# Patient Record
Sex: Male | Born: 1955 | Race: White | Hispanic: No | Smoking: Former smoker
Health system: Southern US, Community
[De-identification: ages and names within clinical notes are randomized; demographics above are authoritative.]

## PROBLEM LIST (undated history)

## (undated) DIAGNOSIS — J449 Chronic obstructive pulmonary disease, unspecified: Secondary | ICD-10-CM

## (undated) DIAGNOSIS — T7840XA Allergy, unspecified, initial encounter: Secondary | ICD-10-CM

## (undated) DIAGNOSIS — J45909 Unspecified asthma, uncomplicated: Secondary | ICD-10-CM

## (undated) DIAGNOSIS — R011 Cardiac murmur, unspecified: Secondary | ICD-10-CM

## (undated) DIAGNOSIS — I2510 Atherosclerotic heart disease of native coronary artery without angina pectoris: Secondary | ICD-10-CM

## (undated) DIAGNOSIS — M1990 Unspecified osteoarthritis, unspecified site: Secondary | ICD-10-CM

## (undated) HISTORY — PX: CORONARY ARTERY BYPASS GRAFT: SHX141

## (undated) HISTORY — DX: Atherosclerotic heart disease of native coronary artery without angina pectoris: I25.10

## (undated) HISTORY — DX: Unspecified osteoarthritis, unspecified site: M19.90

## (undated) HISTORY — DX: Cardiac murmur, unspecified: R01.1

## (undated) HISTORY — PX: HERNIA REPAIR: SHX51

## (undated) HISTORY — DX: Allergy, unspecified, initial encounter: T78.40XA

## (undated) HISTORY — PX: VASECTOMY: SHX75

## (undated) HISTORY — DX: Unspecified asthma, uncomplicated: J45.909

---

## 2004-04-18 ENCOUNTER — Ambulatory Visit: Payer: Self-pay | Admitting: Orthopedic Surgery

## 2004-07-28 ENCOUNTER — Ambulatory Visit: Payer: Self-pay | Admitting: Rheumatology

## 2004-08-12 ENCOUNTER — Ambulatory Visit: Payer: Self-pay | Admitting: Orthopaedic Surgery

## 2004-08-29 ENCOUNTER — Other Ambulatory Visit: Payer: Self-pay

## 2004-08-30 ENCOUNTER — Ambulatory Visit: Payer: Self-pay | Admitting: Orthopaedic Surgery

## 2006-10-23 IMAGING — NM NUCLEAR MEDICINE WHOLE BODY BONE SCINTIGRAPHY
3 series · 8 of 8 positions shown · non-contrast
Comparison: none

REASON FOR EXAM: RT shoulder pain, weight loss,  Dr's office specified
WHOLE BODY
COMMENTS:

[Series 1: 3 hr wholebody (masked) · 2.40mm/px · 2 of 2 frames shown]
[frame 1/2]
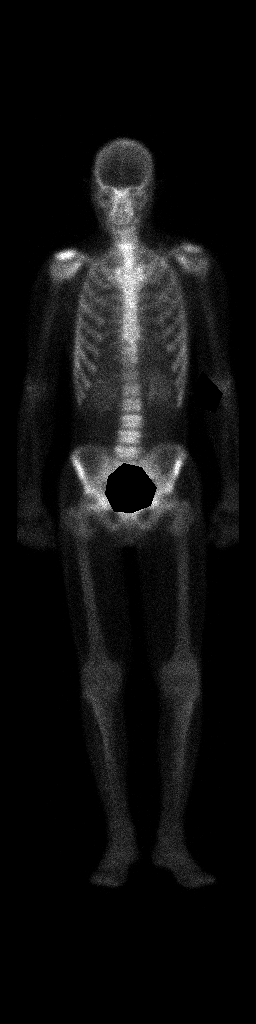
[frame 2/2]
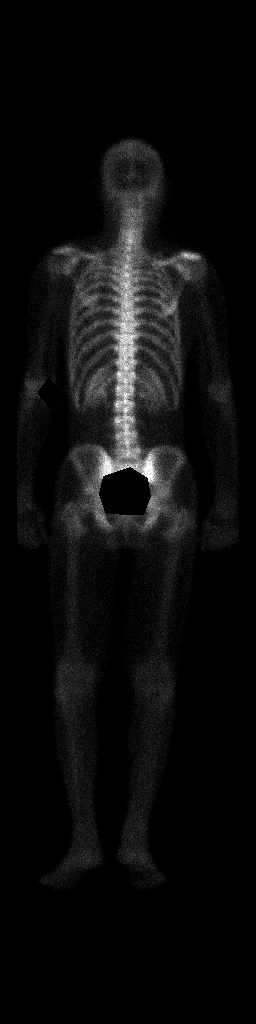

[Series 1: bone statics · 2.40mm/px · 2 acquisitions, 4 frames shown]
[im 1/2  full-range]
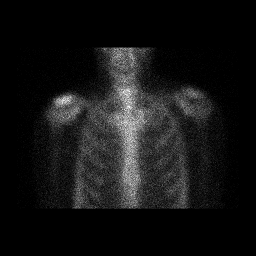
[im 1/2  full-range]
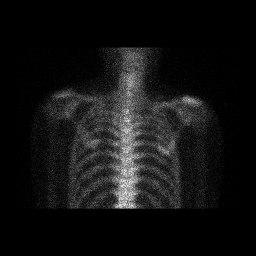
[im 2/2]
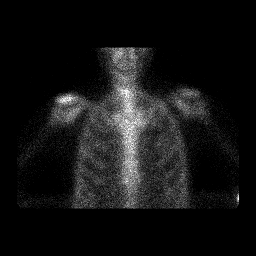
[im 2/2]
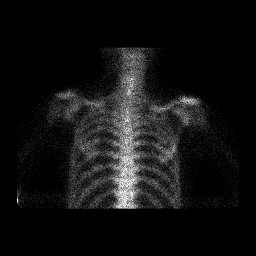

[Series 1: 3 hr wholebody · 2.40mm/px · 2 of 2 frames shown]
[frame 1/2]
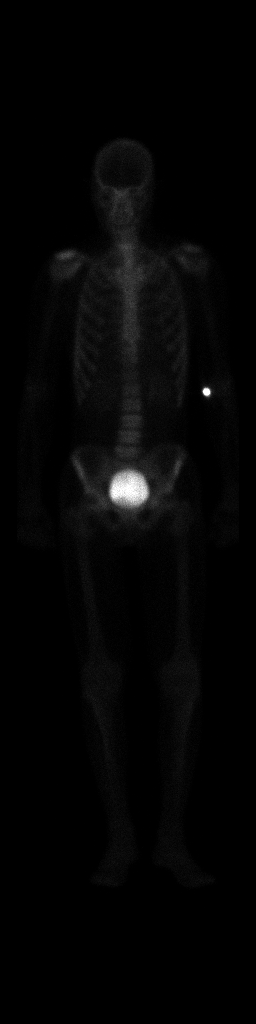
[frame 2/2]
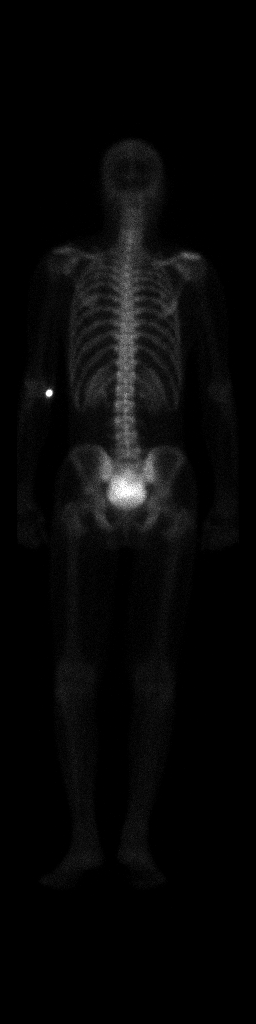

[8 of 8 positions shown; findings below may reference images not displayed]

PROCEDURE:     NM  - NM BONE WB 3 HR [DATE] [DATE]

RESULT:     After injection of 20.81 millicuries of technetium-00m HDP,
anterior and posterior total body gamma scintiphotos were obtained.  No
focal areas of increased tracer are identified.  There is noted increased
tracer in the AC joints bilaterally possibly slightly more on the RIGHT than
the LEFT. Does the patient have osteoarthritic change?  Otherwise the bone
scan is within normal limits.
IMPRESSION: Bilateral increased tracer in the AC joints which may represent
osteoarthritic change slightly greater on the RIGHT than the LEFT.

Otherwise the bone scan is within normal limits.

## 2014-04-27 DIAGNOSIS — Z8249 Family history of ischemic heart disease and other diseases of the circulatory system: Secondary | ICD-10-CM | POA: Insufficient documentation

## 2017-03-02 ENCOUNTER — Other Ambulatory Visit: Payer: Self-pay

## 2019-07-09 DIAGNOSIS — I214 Non-ST elevation (NSTEMI) myocardial infarction: Secondary | ICD-10-CM | POA: Insufficient documentation

## 2019-07-09 DIAGNOSIS — D696 Thrombocytopenia, unspecified: Secondary | ICD-10-CM | POA: Insufficient documentation

## 2019-07-10 DIAGNOSIS — E871 Hypo-osmolality and hyponatremia: Secondary | ICD-10-CM | POA: Insufficient documentation

## 2019-07-10 DIAGNOSIS — E785 Hyperlipidemia, unspecified: Secondary | ICD-10-CM | POA: Insufficient documentation

## 2019-07-10 DIAGNOSIS — J449 Chronic obstructive pulmonary disease, unspecified: Secondary | ICD-10-CM | POA: Diagnosis present

## 2019-07-13 DIAGNOSIS — I6523 Occlusion and stenosis of bilateral carotid arteries: Secondary | ICD-10-CM | POA: Insufficient documentation

## 2021-10-04 ENCOUNTER — Emergency Department: Payer: Medicare Other

## 2021-10-04 ENCOUNTER — Other Ambulatory Visit: Payer: Self-pay

## 2021-10-04 ENCOUNTER — Inpatient Hospital Stay
Admission: EM | Admit: 2021-10-04 | Discharge: 2021-10-06 | DRG: 190 | Disposition: A | Discharge status: Home / Self Care | Payer: Medicare Other | Attending: Internal Medicine | Admitting: Internal Medicine

## 2021-10-04 DIAGNOSIS — J441 Chronic obstructive pulmonary disease with (acute) exacerbation: Secondary | ICD-10-CM | POA: Diagnosis present

## 2021-10-04 DIAGNOSIS — I2510 Atherosclerotic heart disease of native coronary artery without angina pectoris: Secondary | ICD-10-CM | POA: Diagnosis present

## 2021-10-04 DIAGNOSIS — F17200 Nicotine dependence, unspecified, uncomplicated: Secondary | ICD-10-CM | POA: Diagnosis present

## 2021-10-04 DIAGNOSIS — R0902 Hypoxemia: Secondary | ICD-10-CM

## 2021-10-04 DIAGNOSIS — E43 Unspecified severe protein-calorie malnutrition: Secondary | ICD-10-CM | POA: Insufficient documentation

## 2021-10-04 DIAGNOSIS — R0603 Acute respiratory distress: Principal | ICD-10-CM

## 2021-10-04 DIAGNOSIS — F17210 Nicotine dependence, cigarettes, uncomplicated: Secondary | ICD-10-CM | POA: Diagnosis present

## 2021-10-04 DIAGNOSIS — J9601 Acute respiratory failure with hypoxia: Secondary | ICD-10-CM | POA: Diagnosis present

## 2021-10-04 DIAGNOSIS — Z880 Allergy status to penicillin: Secondary | ICD-10-CM

## 2021-10-04 DIAGNOSIS — Z951 Presence of aortocoronary bypass graft: Secondary | ICD-10-CM

## 2021-10-04 DIAGNOSIS — Z20822 Contact with and (suspected) exposure to covid-19: Secondary | ICD-10-CM | POA: Diagnosis present

## 2021-10-04 DIAGNOSIS — Z885 Allergy status to narcotic agent status: Secondary | ICD-10-CM | POA: Diagnosis not present

## 2021-10-04 DIAGNOSIS — J449 Chronic obstructive pulmonary disease, unspecified: Secondary | ICD-10-CM | POA: Diagnosis present

## 2021-10-04 DIAGNOSIS — Z681 Body mass index (BMI) 19 or less, adult: Secondary | ICD-10-CM

## 2021-10-04 HISTORY — DX: Chronic obstructive pulmonary disease, unspecified: J44.9

## 2021-10-04 LAB — COMPREHENSIVE METABOLIC PANEL
ALT: 16 U/L (ref 0–44)
AST: 18 U/L (ref 15–41)
Albumin: 4.2 g/dL (ref 3.5–5.0)
Alkaline Phosphatase: 41 U/L (ref 38–126)
Anion gap: 8 (ref 5–15)
BUN: 7 mg/dL — ABNORMAL LOW (ref 8–23)
CO2: 27 mmol/L (ref 22–32)
Calcium: 9.1 mg/dL (ref 8.9–10.3)
Chloride: 98 mmol/L (ref 98–111)
Creatinine, Ser: 0.88 mg/dL (ref 0.61–1.24)
GFR, Estimated: >60 mL/min (ref >60)
Glucose, Bld: 113 mg/dL — ABNORMAL HIGH (ref 70–99)
Potassium: 4.4 mmol/L (ref 3.5–5.1)
Sodium: 133 mmol/L — ABNORMAL LOW (ref 135–145)
Total Bilirubin: 0.5 mg/dL (ref 0.3–1.2)
Total Protein: 7.6 g/dL (ref 6.5–8.1)

## 2021-10-04 LAB — CBC WITH DIFFERENTIAL/PLATELET
Abs Immature Granulocytes: 0.01 10*3/uL (ref 0.00–0.07)
Basophils Absolute: 0.1 10*3/uL (ref 0.0–0.1)
Basophils Relative: 1 %
Eosinophils Absolute: 1.3 10*3/uL — ABNORMAL HIGH (ref 0.0–0.5)
Eosinophils Relative: 16 %
HCT: 49.3 % (ref 39.0–52.0)
Hemoglobin: 16.2 g/dL (ref 13.0–17.0)
Immature Granulocytes: 0 %
Lymphocytes Relative: 23 %
Lymphs Abs: 1.9 10*3/uL (ref 0.7–4.0)
MCH: 28.8 pg (ref 26.0–34.0)
MCHC: 32.9 g/dL (ref 30.0–36.0)
MCV: 87.7 fL (ref 80.0–100.0)
Monocytes Absolute: 0.6 10*3/uL (ref 0.1–1.0)
Monocytes Relative: 7 %
Neutro Abs: 4.4 10*3/uL (ref 1.7–7.7)
Neutrophils Relative %: 53 %
Platelets: 147 10*3/uL — ABNORMAL LOW (ref 150–400)
RBC: 5.62 MIL/uL (ref 4.22–5.81)
RDW: 14 % (ref 11.5–15.5)
Smear Review: NORMAL
WBC: 8.4 10*3/uL (ref 4.0–10.5)
nRBC: 0 % (ref 0.0–0.2)

## 2021-10-04 LAB — MAGNESIUM: Magnesium: 2 mg/dL (ref 1.7–2.4)

## 2021-10-04 LAB — BRAIN NATRIURETIC PEPTIDE: B Natriuretic Peptide: 12 pg/mL (ref 0.0–100.0)

## 2021-10-04 LAB — RESP PANEL BY RT-PCR (FLU A&B, COVID) ARPGX2
Influenza A by PCR: NEGATIVE
Influenza B by PCR: NEGATIVE
SARS Coronavirus 2 by RT PCR: NEGATIVE

## 2021-10-04 LAB — TROPONIN I (HIGH SENSITIVITY)
Troponin I (High Sensitivity): 9 ng/L (ref <18)
Troponin I (High Sensitivity): 71 ng/L — ABNORMAL HIGH (ref <18)

## 2021-10-04 MED ORDER — IPRATROPIUM-ALBUTEROL 0.5-2.5 (3) MG/3ML IN SOLN
3.0000 mL | Freq: Once | RESPIRATORY_TRACT | Status: AC
  Administered 2021-10-04: 3 mL via RESPIRATORY_TRACT
  Filled 2021-10-04: qty 3

## 2021-10-04 MED ORDER — PREDNISONE 20 MG PO TABS
40.0000 mg | ORAL_TABLET | Freq: Every day | ORAL | Status: DC
  Administered 2021-10-06: 40 mg via ORAL
  Filled 2021-10-04: qty 2

## 2021-10-04 MED ORDER — METHYLPREDNISOLONE SODIUM SUCC 40 MG IJ SOLR
40.0000 mg | Freq: Two times a day (BID) | INTRAMUSCULAR | Status: AC
  Administered 2021-10-05: 40 mg via INTRAVENOUS
  Filled 2021-10-04 (×2): qty 1

## 2021-10-04 MED ORDER — LORAZEPAM 2 MG/ML IJ SOLN: 0.5000 mg | Freq: Once | INTRAMUSCULAR | Status: DC

## 2021-10-04 MED ORDER — ONDANSETRON HCL 4 MG/2ML IJ SOLN: 4.0000 mg | Freq: Four times a day (QID) | INTRAMUSCULAR | Status: DC | PRN

## 2021-10-04 MED ORDER — SODIUM CHLORIDE 0.9% FLUSH: 3.0000 mL | INTRAVENOUS | Status: DC | PRN

## 2021-10-04 MED ORDER — SODIUM CHLORIDE 0.9% FLUSH
3.0000 mL | Freq: Two times a day (BID) | INTRAVENOUS | Status: DC
  Administered 2021-10-04 – 2021-10-06 (×5): 3 mL via INTRAVENOUS

## 2021-10-04 MED ORDER — ENOXAPARIN SODIUM 40 MG/0.4ML IJ SOSY
40.0000 mg | PREFILLED_SYRINGE | INTRAMUSCULAR | Status: DC
  Administered 2021-10-04: 40 mg via SUBCUTANEOUS
  Filled 2021-10-04 (×2): qty 0.4

## 2021-10-04 MED ORDER — ALBUTEROL SULFATE (2.5 MG/3ML) 0.083% IN NEBU
2.5000 mg | INHALATION_SOLUTION | Freq: Once | RESPIRATORY_TRACT | Status: AC
  Administered 2021-10-04: 2.5 mg via RESPIRATORY_TRACT
  Filled 2021-10-04: qty 3

## 2021-10-04 MED ORDER — ACETAMINOPHEN 325 MG PO TABS
650.0000 mg | ORAL_TABLET | Freq: Four times a day (QID) | ORAL | Status: DC | PRN
  Administered 2021-10-04: 650 mg via ORAL
  Filled 2021-10-04: qty 2

## 2021-10-04 MED ORDER — ALBUTEROL SULFATE (2.5 MG/3ML) 0.083% IN NEBU: 2.5000 mg | INHALATION_SOLUTION | RESPIRATORY_TRACT | Status: DC | PRN

## 2021-10-04 MED ORDER — SODIUM CHLORIDE 0.9 % IV SOLN: 250.0000 mL | INTRAVENOUS | Status: DC | PRN

## 2021-10-04 MED ORDER — DOXYCYCLINE HYCLATE 100 MG PO TABS
100.0000 mg | ORAL_TABLET | Freq: Two times a day (BID) | ORAL | Status: DC
  Administered 2021-10-04 – 2021-10-06 (×5): 100 mg via ORAL
  Filled 2021-10-04 (×5): qty 1

## 2021-10-04 MED ORDER — IPRATROPIUM-ALBUTEROL 0.5-2.5 (3) MG/3ML IN SOLN
3.0000 mL | Freq: Four times a day (QID) | RESPIRATORY_TRACT | Status: DC
  Administered 2021-10-04 – 2021-10-06 (×6): 3 mL via RESPIRATORY_TRACT
  Filled 2021-10-04 (×7): qty 3

## 2021-10-04 MED ORDER — MAGNESIUM SULFATE 2 GM/50ML IV SOLN
2.0000 g | Freq: Once | INTRAVENOUS | Status: AC
Start: 2021-10-04 — End: 2021-10-04
  Administered 2021-10-04: 2 g via INTRAVENOUS
  Filled 2021-10-04: qty 50

## 2021-10-04 MED ORDER — METHYLPREDNISOLONE SODIUM SUCC 125 MG IJ SOLR
125.0000 mg | Freq: Once | INTRAMUSCULAR | Status: AC
  Administered 2021-10-04: 125 mg via INTRAVENOUS
  Filled 2021-10-04: qty 2

## 2021-10-04 MED ORDER — ONDANSETRON HCL 4 MG PO TABS: 4.0000 mg | ORAL_TABLET | Freq: Four times a day (QID) | ORAL | Status: DC | PRN

## 2021-10-04 MED ORDER — ACETAMINOPHEN 650 MG RE SUPP: 650.0000 mg | Freq: Four times a day (QID) | RECTAL | Status: DC | PRN

## 2021-10-04 NOTE — Assessment & Plan Note (Signed)
Status post CABG ?Stable ?

## 2021-10-04 NOTE — H&P (Signed)
?History and Physical  ? ? ?Patient: Guy Bowen [id number] DOB: Apr 30, 1956 ?DOA: 10/04/2021 ?DOS: the patient was seen and examined on 10/04/2021 ?PCP: Pcp, No  ?Patient coming from: Home ? ?Chief Complaint:  ?Chief Complaint  ?Patient presents with  ? Shortness of Breath  ? ?HPI: Guy Bowen is a 66 y.o. male with medical history significant coronary artery disease status post CABG, nicotine dependence, COPD who presents to the ER for evaluation of worsening shortness of breath from his baseline associated with a cough productive of yellow phlegm. ?Patient states that he has had exertional shortness of breath for several months but on the morning of admission he developed shortness of breath at rest and did not improve after he used his nebulizer.  He called EMS and was noted to have trouble speaking in complete sentences.  Patient was unable to tolerate CPAP in the field and was placed on a nonrebreather mask.  He was transitioned to high flow oxygen in the ER ?He denies having any chest pain, no fever, no chills, no nausea, no palpitations, no diaphoresis, no changes in his bowel habits, no urinary symptoms, no blurred vision or focal deficit. ? ?Review of Systems: As mentioned in the history of present illness. All other systems reviewed and are negative. ?Past Medical History:  ?Diagnosis Date  ? COPD (chronic obstructive pulmonary disease) ([place])   ? ?Past Surgical History:  ?Procedure Laterality Date  ? CORONARY ARTERY BYPASS GRAFT    ? ?Social History:  reports that he has quit smoking. His smoking use included cigarettes. He has never used smokeless tobacco. He reports that he does not use drugs. No history on file for alcohol use. ? ?Allergies  ?Allergen Reactions  ? Penicillins Anaphylaxis  ? Codeine   ? ? ?History reviewed. No pertinent family history. ? ?Prior to Admission medications   ?Not on File  ? ? ?Physical Exam: ?Vitals:  ? 10/04/21 0604 10/04/21 0700 10/04/21 0830 10/04/21 0841  ?BP:  (!)  141/93 (!) 125/93   ?Pulse: (!) 121 (!) 104 99   ?Resp: (!) 27 (!) 21 16   ?Temp:      ?TempSrc:      ?SpO2: (!) 89% 99% 98% 97%  ?Weight:      ?Height:      ? ?Physical Exam ?Vitals and nursing note reviewed.  ?Constitutional:   ?   Appearance: He is well-developed.  ?   Comments: Chronically ill-appearing  ?HENT:  ?   Head: Normocephalic and atraumatic.  ?   Mouth/Throat:  ?   Mouth: Mucous membranes are moist.  ?Eyes:  ?   Pupils: Pupils are equal, round, and reactive to light.  ?Cardiovascular:  ?   Rate and Rhythm: Tachycardia present.  ?Pulmonary:  ?   Effort: Tachypnea present.  ?   Breath sounds: Examination of the right-upper field reveals wheezing. Examination of the left-upper field reveals wheezing. Examination of the right-middle field reveals wheezing. Examination of the left-middle field reveals wheezing. Examination of the right-lower field reveals wheezing. Examination of the left-lower field reveals wheezing. Wheezing present.  ?Abdominal:  ?   General: Bowel sounds are normal.  ?   Palpations: Abdomen is soft.  ?Musculoskeletal:     ?   General: Normal range of motion.  ?   Cervical back: Normal range of motion and neck supple.  ?Skin: ?   General: Skin is warm and dry.  ?Neurological:  ?   General: No focal deficit present.  ?  Mental Status: He is alert and oriented to person, place, and time.  ? ? ?Data Reviewed: ?Relevant notes from primary care and specialist visits, past discharge summaries as available in EHR, including Care Everywhere. ?Prior diagnostic testing as pertinent to current admission diagnoses ?Updated medications and problem lists for reconciliation ?ED course, including vitals, labs, imaging, treatment and response to treatment ?Triage notes, nursing and pharmacy notes and ED provider's notes ?Notable results as noted in HPI ?Labs reviewed.  BNP 12, white count 8.4, hemoglobin 16.2, hematocrit 49.3, RDW 14, platelet count 147, troponin 9, magnesium 2.0, sodium 133, potassium  4.4, glucose 113, BUN 7 ?Respiratory viral panel is negative ?Chest x-ray reviewed by me shows hyperinflated lung fields.  No evidence of acute cardiopulmonary disease. ?Twelve-lead EKG reviewed sinus tachycardia.  Left axis deviation. ?There are no new results to review at this time. ? ?Assessment and Plan: ?* COPD with acute exacerbation ([place]) ?Patient with a history of COPD who presents for worsening shortness of breath from his baseline associated with a cough productive of brown phlegm. ?He had increased work of breathing upon arrival to the ER and is currently on high flow oxygen ?We will attempt to wean off oxygen as tolerated ?Place patient on doxycycline 100 mg twice daily ?Place patient on scheduled and as needed bronchodilator therapy ? ?CAD (coronary artery disease) ?Status post CABG ?Stable ? ?Nicotine dependence ?Patient admits to continued nicotine use but has cut back. ?Declines a nicotine transdermal patch at this time ? ? ? ? ? Advance Care Planning:   Code Status: Full Code  ? ?Consults: None ? ?Family Communication: Greater than 50% of time was spent discussing patient's condition and plan of care with him at the bedside.  All questions and concerns have been addressed.  He verbalizes understanding and agrees with the plan. ? ?Severity of Illness: ?The appropriate patient status for this patient is INPATIENT. Inpatient status is judged to be reasonable and necessary in order to provide the required intensity of service to ensure the patient's safety. The patient's presenting symptoms, physical exam findings, and initial radiographic and laboratory data in the context of their chronic comorbidities is felt to place them at high risk for further clinical deterioration. Furthermore, it is not anticipated that the patient will be medically stable for discharge from the hospital within 2 midnights of admission.  ? ?* I certify that at the point of admission it is my clinical judgment that the patient  will require inpatient hospital care spanning beyond 2 midnights from the point of admission due to high intensity of service, high risk for further deterioration and high frequency of surveillance required.* ? ?Author: ?Collier Bullock, MD ?10/04/2021 9:01 AM ? ?For on call review [email or web address].  ?

## 2021-10-04 NOTE — ED Provider Notes (Signed)
----------------------------------------- ?  7:03 AM on 10/04/2021 ?----------------------------------------- ? ?Blood pressure (!) 141/93, pulse (!) 104, temperature (!) 97.5 ?F (36.4 ?C), temperature source Oral, resp. rate (!) 21, height 6' (1.829 m), weight 63.5 kg, SpO2 99 %. ? ?Assuming care from Dr. Beather Arbour.  In short, Guy Bowen is a 66 y.o. male with a chief complaint of Shortness of Breath ?Marland Kitchen  Refer to the original H&P for additional details. ? ?The current plan of care is to follow-up labs and reassess for COPD exacerbation, anticipate admission. ? ?----------------------------------------- ?8:05 AM on 10/04/2021 ?----------------------------------------- ?On reassessment, patient states he is feeling much better and is maintaining O2 sats on 30 L/min of high flow nasal cannula.  His work of breathing seems to be improving with respiratory rate in the low 20s, although he does have some ongoing wheezing.  We will give additional DuoNeb and attempt to wean to standard nasal cannula, case discussed with hospitalist for admission. ? ?  ?Blake Divine, MD ?10/04/21 [phone] ? ?

## 2021-10-04 NOTE — Assessment & Plan Note (Signed)
Patient admits to continued nicotine use but has cut back. ?Declines a nicotine transdermal patch at this time ?

## 2021-10-04 NOTE — Assessment & Plan Note (Signed)
Patient with a history of COPD who presents for worsening shortness of breath from his baseline associated with a cough productive of brown phlegm. ?He had increased work of breathing upon arrival to the ER and is currently on high flow oxygen ?We will attempt to wean off oxygen as tolerated ?Place patient on doxycycline 100 mg twice daily ?Place patient on scheduled and as needed bronchodilator therapy ?

## 2021-10-04 NOTE — Plan of Care (Signed)
  Problem: Activity: Goal: Ability to tolerate increased activity will improve Outcome: Progressing   

## 2021-10-04 NOTE — ED Provider Notes (Signed)
? ?[facility] ?Provider Note ? ? ? Event Date/Time  ? First MD Initiated Contact with Patient 10/04/21 [phone]   ?  (approximate) ? ? ?History  ? ?Shortness of Breath ? ? ?HPI ? ?Level of V caveat: Limited by 2-3 word sentences ? ?Guy BodoRobert Bowen is a 66 y.o. male brought to the ED via EMS from home with a chief complaint of respiratory distress x1.5 hours.  Patient with a history of COPD not on home oxygen.  Endorses chest tightness and mild cough.  Denies fever, chills, abdominal pain, nausea, vomiting or dizziness.  EMS gave 1 spray nitroglycerin with improvement in patient's blood pressure.  Patient could not tolerate CPAP.  Arrives to the ED on nonrebreather oxygen. ?  ? ? ?Past Medical History  ?COPD ?CAD ? ?Active Problem List  ?There are no problems to display for this patient. ? ? ? ?Past Surgical History  ?CABG ? ? ?Home Medications  ? ?Prior to Admission medications   ?Not on File  ? ? ? ?Allergies  ?Penicillins ? ? ?Family History  ?History reviewed. No pertinent family history. ? ? ?Physical Exam  ?Triage Vital Signs: ?ED Triage Vitals  ?Enc Vitals Group  ?   BP   ?   Pulse   ?   Resp   ?   Temp   ?   Temp src   ?   SpO2   ?   Weight   ?   Height   ?   Head Circumference   ?   Peak Flow   ?   Pain Score   ?   Pain Loc   ?   Pain Edu?   ?   Excl. in GC?   ? ? ?Updated Vital Signs: ?BP (!) 141/93   Pulse (!) 104   Temp (!) 97.5 ?F (36.4 ?C) (Oral)   Resp (!) 21   Ht 6' (1.829 m)   Wt 63.5 kg   SpO2 99%   BMI 18.99 kg/m?  ? ? ?General: Awake, moderate distress.  ?CV:  Tachycardic.  Good peripheral perfusion.  ?Resp:  Increased effort.  Diminished aeration bilaterally with scattered wheezing.  Tripoding, guppy breathing. ?Abd:  Nontender.  No distention.  ?Other:  Calves are nontender and nonswollen. ? ? ?ED Results / Procedures / Treatments  ?Labs ?(all labs ordered are listed, but only abnormal results are displayed) ?Labs Reviewed  ?COMPREHENSIVE METABOLIC PANEL - Abnormal;  Notable for the following components:  ?    Result Value  ? Sodium 133 (*)   ? Glucose, Bld 113 (*)   ? BUN 7 (*)   ? All other components within normal limits  ?RESP PANEL BY RT-PCR (FLU A&B, COVID) ARPGX2  ?MAGNESIUM  ?CBC WITH DIFFERENTIAL/PLATELET  ?BRAIN NATRIURETIC PEPTIDE  ?TROPONIN I (HIGH SENSITIVITY)  ? ? ? ?EKG ? ?ED ECG REPORT ?I, Irean HongSUNG,Kemoni Ortega J, the attending physician, personally viewed and interpreted this ECG. ? ? Date: 10/04/2021 ? EKG Time: 0553 ? Rate: 120 ? Rhythm: sinus tachycardia ? Axis: Normal ? Intervals:none ? ST&T Change: Nonspecific ? ? ? ?RADIOLOGY ?I have independently visualized and interpreted patient's chest x-ray as well as noted the radiology interpretation: ? ?Chest x-ray: No acute cardiopulmonary process ? ?Official radiology report(s): ?DG Chest Port 1 View ? ?Result Date: 10/04/2021 ?CLINICAL DATA:  Shortness of breath with a history of COPD. EXAM: PORTABLE CHEST 1 VIEW COMPARISON:  None. FINDINGS: There is overlying monitor wiring. There are intact sternotomy sutures  with CABG changes. Heart size and vasculature are normal apart from mild aortic atherosclerosis. The lungs are emphysematous but clear. The mediastinum is normally outlined. Mild osteopenia. IMPRESSION: No evidence of acute chest disease. COPD. Prior CABG. Aortic atherosclerosis. Electronically Signed   By: Almira Bar M.D.   On: 10/04/2021 06:13   ? ? ?PROCEDURES: ? ?Critical Care performed: Yes, see critical care procedure note(s) ? ?CRITICAL CARE ?Performed by: Irean Hong ? ? ?Total critical care time: 30 minutes ? ?Critical care time was exclusive of separately billable procedures and treating other patients. ? ?Critical care was necessary to treat or prevent imminent or life-threatening deterioration. ? ?Critical care was time spent personally by me on the following activities: development of treatment plan with patient and/or surrogate as well as nursing, discussions with consultants, evaluation of patient's  response to treatment, examination of patient, obtaining history from patient or surrogate, ordering and performing treatments and interventions, ordering and review of laboratory studies, ordering and review of radiographic studies, pulse oximetry and re-evaluation of patient's condition. ? ? ?.1-3 Lead EKG Interpretation ?Performed by: Irean Hong, MD ?Authorized by: Irean Hong, MD  ? ?  Interpretation: abnormal   ?  ECG rate:  120 ?  ECG rate assessment: tachycardic   ?  Rhythm: sinus tachycardia   ?  Ectopy: none   ?  Conduction: normal   ?Comments:  ?   Patient placed on cardiac monitor to evaluate for arrhythmias ? ? ?MEDICATIONS ORDERED IN ED: ?Medications  ?LORazepam (ATIVAN) injection 0.5 mg (0 mg Intravenous Hold 10/04/21 0617)  ?ipratropium-albuterol (DUONEB) 0.5-2.5 (3) MG/3ML nebulizer solution 3 mL (3 mLs Nebulization Given 10/04/21 0607)  ?methylPREDNISolone sodium succinate (SOLU-MEDROL) 125 mg/2 mL injection 125 mg (125 mg Intravenous Given 10/04/21 0606)  ?magnesium sulfate IVPB 2 g 50 mL (2 g Intravenous New Bag/Given 10/04/21 0607)  ?ipratropium-albuterol (DUONEB) 0.5-2.5 (3) MG/3ML nebulizer solution 3 mL (3 mLs Nebulization Given 10/04/21 0607)  ?ipratropium-albuterol (DUONEB) 0.5-2.5 (3) MG/3ML nebulizer solution 3 mL (3 mLs Nebulization Given 10/04/21 0607)  ? ? ? ?IMPRESSION / MDM / ASSESSMENT AND PLAN / ED COURSE  ?I reviewed the triage vital signs and the nursing notes. ?             ?               ?66 year old male presenting to the ED in respiratory distress. Differential includes, but is not limited to, viral syndrome, bronchitis including COPD exacerbation, pneumonia, reactive airway disease including asthma, CHF including exacerbation with or without pulmonary/interstitial edema, pneumothorax, ACS, thoracic trauma, and pulmonary embolism.  I have personally reviewed patient's records and see that there are no old records to review. ? ?The patient is on the cardiac monitor to evaluate for  evidence of arrhythmia and/or significant heart rate changes. ? ?We will obtain cardiac panel, chest x-ray, respiratory panel.  Will initiate treatment with stacked DuoNebs, IV Solu-Medrol, IV magnesium.  Will administer low-dose anxiolytic so patient may tolerate BiPAP.  Anticipate hospitalization. ? ?Clinical Course as of 10/04/21 0708  ?Tue Oct 04, 2021  ?2542 Patient placed on high flow nasal cannula due to not being able to tolerate BiPAP mask.  He is looking and feeling better.  Awaiting lab work. [JS]  ?7062 Care transferred to Dr. Larinda Buttery at change of shift pending labwork and admission. [JS]  ?  ?Clinical Course User Index ?[JS] Irean Hong, MD  ? ? ? ?FINAL CLINICAL IMPRESSION(S) / ED DIAGNOSES  ? ?  Final diagnoses:  ?Respiratory distress  ?COPD exacerbation (HCC)  ?Hypoxia  ? ? ? ?Rx / DC Orders  ? ?ED Discharge Orders   ? ? None  ? ?  ? ? ? ?Note:  This document was prepared using Dragon voice recognition software and may include unintentional dictation errors. ?  ?Irean Hong, MD ?10/04/21 [phone] ? ?

## 2021-10-04 NOTE — ED Notes (Signed)
Informed RN bed assigned 

## 2021-10-04 NOTE — ED Triage Notes (Signed)
Pt presents to ER c/o sob x1.5 hours.  Pt has hx of copd and used a neb tx without relief.  Pt denies wearing O2 at home.  Pt was 92% on RA at home on ems arrival.  Pt having trouble speaking in complete sentences at this time.  Pt received one spay of nitro w/ems pta.  Pt is A&O x4 at this time.   ?

## 2021-10-05 DIAGNOSIS — J441 Chronic obstructive pulmonary disease with (acute) exacerbation: Secondary | ICD-10-CM | POA: Diagnosis not present

## 2021-10-05 LAB — BASIC METABOLIC PANEL
Anion gap: 6 (ref 5–15)
BUN: 8 mg/dL (ref 8–23)
CO2: 27 mmol/L (ref 22–32)
Calcium: 8.7 mg/dL — ABNORMAL LOW (ref 8.9–10.3)
Chloride: 96 mmol/L — ABNORMAL LOW (ref 98–111)
Creatinine, Ser: 0.74 mg/dL (ref 0.61–1.24)
GFR, Estimated: >60 mL/min (ref >60)
Glucose, Bld: 104 mg/dL — ABNORMAL HIGH (ref 70–99)
Potassium: 4.5 mmol/L (ref 3.5–5.1)
Sodium: 129 mmol/L — ABNORMAL LOW (ref 135–145)

## 2021-10-05 LAB — CBC
HCT: 42.5 % (ref 39.0–52.0)
Hemoglobin: 14.4 g/dL (ref 13.0–17.0)
MCH: 29.1 pg (ref 26.0–34.0)
MCHC: 33.9 g/dL (ref 30.0–36.0)
MCV: 86 fL (ref 80.0–100.0)
Platelets: 140 10*3/uL — ABNORMAL LOW (ref 150–400)
RBC: 4.94 MIL/uL (ref 4.22–5.81)
RDW: 13.9 % (ref 11.5–15.5)
WBC: 11.8 10*3/uL — ABNORMAL HIGH (ref 4.0–10.5)
nRBC: 0 % (ref 0.0–0.2)

## 2021-10-05 LAB — HIV ANTIBODY (ROUTINE TESTING W REFLEX): HIV Screen 4th Generation wRfx: NONREACTIVE

## 2021-10-05 MED ORDER — ARFORMOTEROL TARTRATE 15 MCG/2ML IN NEBU
15.0000 ug | INHALATION_SOLUTION | Freq: Two times a day (BID) | RESPIRATORY_TRACT | Status: DC
  Administered 2021-10-05 – 2021-10-06 (×2): 15 ug via RESPIRATORY_TRACT
  Filled 2021-10-05 (×2): qty 2

## 2021-10-05 MED ORDER — BUDESONIDE 0.25 MG/2ML IN SUSP
0.2500 mg | Freq: Two times a day (BID) | RESPIRATORY_TRACT | Status: DC
  Administered 2021-10-05 – 2021-10-06 (×2): 0.25 mg via RESPIRATORY_TRACT
  Filled 2021-10-05 (×3): qty 2

## 2021-10-05 MED ORDER — ADULT MULTIVITAMIN W/MINERALS CH
1.0000 | ORAL_TABLET | Freq: Every day | ORAL | Status: DC
  Administered 2021-10-05 – 2021-10-06 (×2): 1 via ORAL
  Filled 2021-10-05 (×2): qty 1

## 2021-10-05 MED ORDER — ENSURE ENLIVE PO LIQD
237.0000 mL | Freq: Two times a day (BID) | ORAL | Status: DC
  Administered 2021-10-06: 237 mL via ORAL

## 2021-10-05 NOTE — Progress Notes (Addendum)
Initial Nutrition Assessment ? ?DOCUMENTATION CODES:  ? ?Underweight, Severe malnutrition in context of chronic illness ? ?INTERVENTION:  ? ?-MVI with minerals daily ?-Ensure Enlive po BID, each supplement provides 350 kcal and 20 grams of protein ?-Liberalize diet to regular for widest variety of food selections ? ?NUTRITION DIAGNOSIS:  ? ?Severe Malnutrition related to chronic illness (COPD) as evidenced by moderate fat depletion, severe fat depletion, moderate muscle depletion, severe muscle depletion. ? ?GOAL:  ? ?Patient will meet greater than or equal to 90% of their needs ? ?MONITOR:  ? ?PO intake, Supplement acceptance, Labs, Weight trends, Skin, I & O's ? ?REASON FOR ASSESSMENT:  ? ?Malnutrition Screening Tool ?  ? ?ASSESSMENT:  ? ?Guy Bowen is a 66 y.o. male with medical history significant coronary artery disease status post CABG, nicotine dependence, COPD who presents to the ER for evaluation of worsening shortness of breath from his baseline associated with a cough productive of yellow phlegm. ? ?Pt admitted with COPD exacerbation.   ? ?Reviewed I/O's: -410 ml x 24 hours ? ?UOP: 700 ml x 24 hours ? ?Spoke with pt at bedside, who was pleasant and in good spirits today. He reports his appetite has improved since hospitalization and has been eating 100% of his meals. He shares that he has had poor oral intake over the past few weeks in addition to weakness secondary to shortness of breath. He shares that it has been more difficult to walk to the kitchen to prepare meals and coffee. He usually consumes 2 meals per day, which consists of simple foods such as sandwiches and hot dogs.  ? ?Pt reports his UBW is around 140#, but admits to a 15 pound wt loss over the past few months. Per pt, he has not paid as much attention to his health since 2018, as he was caring for his wife with pancreatic cancer and has been grieving her passing. RD provided emotional support.  ? ?Discussed importance of good meal and  supplement intake to promote healing.  ? ?Medications reviewed and include ativan and solu-medrol.  ? ?Labs reviewed: Na: 129.   ? ?NUTRITION - FOCUSED PHYSICAL EXAM: ? ?Flowsheet Row Most Recent Value  ?Orbital Region Moderate depletion  ?Upper Arm Region Severe depletion  ?Thoracic and Lumbar Region Severe depletion  ?Buccal Region Severe depletion  ?Temple Region Severe depletion  ?Clavicle Bone Region Severe depletion  ?Clavicle and Acromion Bone Region Severe depletion  ?Scapular Bone Region Severe depletion  ?Dorsal Hand Moderate depletion  ?Patellar Region Moderate depletion  ?Anterior Thigh Region Moderate depletion  ?Posterior Calf Region Moderate depletion  ?Edema (RD Assessment) None  ?Hair Reviewed  ?Eyes Reviewed  ?Mouth Reviewed  ?Skin Reviewed  ?Nails Reviewed  ? ?  ? ? ?Diet Order:   ?Diet Order   ? ?       ?  Diet 2 gram sodium Room service appropriate? Yes; Fluid consistency: Thin  Diet effective now       ?  ? ?  ?  ? ?  ? ? ?EDUCATION NEEDS:  ? ?Education needs have been addressed ? ?Skin:  Skin Assessment: Reviewed RN Assessment ? ?Last BM:  10/05/21 ? ?Height:  ? ?Ht Readings from Last 1 Encounters:  ?10/04/21 5\' 11"  (1.803 m)  ? ? ?Weight:  ? ?Wt Readings from Last 1 Encounters:  ?10/04/21 57.3 kg  ? ? ?Ideal Body Weight:  78.2 kg ? ?BMI:  Body mass index is 17.62 kg/m?. ? ?Estimated Nutritional Needs:  ? ?  Kcal:  2100-2300 ? ?Protein:  110-125 grams ? ?Fluid:  > 2 L ? ? ? ?Levada Schilling, RD, LDN, CDCES ?Registered Dietitian II ?Certified Diabetes Care and Education Specialist ?Please refer to [facility] for RD and/or RD on-call/weekend/after hours pager  ?

## 2021-10-05 NOTE — Progress Notes (Signed)
?PROGRESS NOTE ? ? ? ?Guy Bowen  XBM:[id number] DOB: 1955-07-28 DOA: 10/04/2021 ?PCP: Pcp, No  ? ? ?Brief Narrative:  ? 66 y.o. male with medical history significant coronary artery disease status post CABG, nicotine dependence, COPD who presents to the ER for evaluation of worsening shortness of breath from his baseline associated with a cough productive of yellow phlegm. ?Patient states that he has had exertional shortness of breath for several months but on the morning of admission he developed shortness of breath at rest and did not improve after he used his nebulizer.  He called EMS and was noted to have trouble speaking in complete sentences.  Patient was unable to tolerate CPAP in the field and was placed on a nonrebreather mask.  He was transitioned to high flow oxygen in the ER ?He denies having any chest pain, no fever, no chills, no nausea, no palpitations, no diaphoresis, no changes in his bowel habits, no urinary symptoms, no blurred vision or focal deficit. ? ? ? ? ?Assessment & Plan: ?  ?Principal Problem: ?  COPD with acute exacerbation (HCC) ?Active Problems: ?  CAD (coronary artery disease) ?  Nicotine dependence ? ?* COPD with acute exacerbation (HCC) ?Acute hypoxic respiratory failure secondary to above ?Patient with a history of COPD who presents for worsening shortness of breath from his baseline associated with a cough productive of brown phlegm. ?He had increased work of breathing upon arrival to the ER  ?Initially required high flow oxygen ?Plan: ?Now on 2 L oxygen, wean as tolerated ?Stress incentive spirometry use ?Steroids ?Bronchodilators ?Empiric doxycycline ?Possible discharge in 24 hours if able to wean from oxygen ?  ?CAD (coronary artery disease) ?Status post CABG ?Stable ?  ?Nicotine dependence ?Patient admits to continued nicotine use but has cut back. ?Offer nicotine patch, patient inclined ? ? ?DVT prophylaxis: SQ Lovenox ?Code Status: Full ?Family Communication:  None ?Disposition Plan: Status is: Inpatient ?Remains inpatient appropriate because: Acute exacerbation of COPD.  Anticipate discharge 4/6 ? ? ?Level of care: Progressive ? ?Consultants:  ?None ? ?Procedures:  ?None ? ?Antimicrobials: ?Doxycycline ? ? ?Subjective: ?Seen and examined.  No acute distress.  Sitting up in bed.  Endorses that shortness of breath is improving but not yet at baseline ? ?Objective: ?Vitals:  ? 10/05/21 0410 10/05/21 0821 10/05/21 0917 10/05/21 1247  ?BP: 118/72  108/70 104/72  ?Pulse: 88  99 88  ?Resp: 18  18 20   ?Temp: 99.4 ?F (37.4 ?C)  98 ?F (36.7 ?C) 98 ?F (36.7 ?C)  ?TempSrc: Oral     ?SpO2: 99% 98% 95% 97%  ?Weight:      ?Height:      ? ? ?Intake/Output Summary (Last 24 hours) at 10/05/2021 1453 ?Last data filed at 10/05/2021 0500 ?Gross per 24 hour  ?Intake 240 ml  ?Output 700 ml  ?Net -460 ml  ? ?Filed Weights  ? 10/04/21 0552 10/04/21 1807  ?Weight: 63.5 kg 57.3 kg  ? ? ?Examination: ? ?General exam: Appears calm and comfortable  ?Respiratory system: Scattered crackles bilaterally.  Mild end expiratory wheeze.  Normal work of breathing.  2 L ?Cardiovascular system: S1-S2, RRR, no murmurs, no pedal edema ?Gastrointestinal system: Soft, NT/ND, normal bowel sounds ?Central nervous system: Alert and oriented. No focal neurological deficits. ?Extremities: Symmetric 5 x 5 power. ?Skin: No rashes, lesions or ulcers ?Psychiatry: Judgement and insight appear normal. Mood & affect appropriate.  ? ? ? ?Data Reviewed: I have personally reviewed following labs and imaging studies ? ?  CBC: ?Recent Labs  ?Lab 10/04/21 ?2671 10/05/21 ?2458  ?WBC 8.4 11.8*  ?NEUTROABS 4.4  --   ?HGB 16.2 14.4  ?HCT 49.3 42.5  ?MCV 87.7 86.0  ?PLT 147* 140*  ? ?Basic Metabolic Panel: ?Recent Labs  ?Lab 10/04/21 ?0998 10/05/21 ?3382  ?NA 133* 129*  ?K 4.4 4.5  ?CL 98 96*  ?CO2 27 27  ?GLUCOSE 113* 104*  ?BUN 7* 8  ?CREATININE 0.88 0.74  ?CALCIUM 9.1 8.7*  ?MG 2.0  --   ? ?GFR: ?Estimated Creatinine Clearance: 72.6 mL/min  (by C-G formula based on SCr of 0.74 mg/dL). ?Liver Function Tests: ?Recent Labs  ?Lab 10/04/21 ?5053  ?AST 18  ?ALT 16  ?ALKPHOS 41  ?BILITOT 0.5  ?PROT 7.6  ?ALBUMIN 4.2  ? ?No results for input(s): LIPASE, AMYLASE in the last 168 hours. ?No results for input(s): AMMONIA in the last 168 hours. ?Coagulation Profile: ?No results for input(s): INR, PROTIME in the last 168 hours. ?Cardiac Enzymes: ?No results for input(s): CKTOTAL, CKMB, CKMBINDEX, TROPONINI in the last 168 hours. ?BNP (last 3 results) ?No results for input(s): PROBNP in the last 8760 hours. ?HbA1C: ?No results for input(s): HGBA1C in the last 72 hours. ?CBG: ?No results for input(s): GLUCAP in the last 168 hours. ?Lipid Profile: ?No results for input(s): CHOL, HDL, LDLCALC, TRIG, CHOLHDL, LDLDIRECT in the last 72 hours. ?Thyroid Function Tests: ?No results for input(s): TSH, T4TOTAL, FREET4, T3FREE, THYROIDAB in the last 72 hours. ?Anemia Panel: ?No results for input(s): VITAMINB12, FOLATE, FERRITIN, TIBC, IRON, RETICCTPCT in the last 72 hours. ?Sepsis Labs: ?No results for input(s): PROCALCITON, LATICACIDVEN in the last 168 hours. ? ?Recent Results (from the past 240 hour(s))  ?Resp Panel by RT-PCR (Flu A&B, Covid) Nasopharyngeal Swab     Status: None  ? Collection Time: 10/04/21  6:04 AM  ? Specimen: Nasopharyngeal Swab; Nasopharyngeal(NP) swabs in vial transport medium  ?Result Value Ref Range Status  ? SARS Coronavirus 2 by RT PCR NEGATIVE NEGATIVE Final  ?  Comment: (NOTE) ?SARS-CoV-2 target nucleic acids are NOT DETECTED. ? ?The SARS-CoV-2 RNA is generally detectable in upper respiratory ?specimens during the acute phase of infection. The lowest ?concentration of SARS-CoV-2 viral copies this assay can detect is ?138 copies/mL. A negative result does not preclude SARS-Cov-2 ?infection and should not be used as the sole basis for treatment or ?other patient management decisions. A negative result may occur with  ?improper specimen  collection/handling, submission of specimen other ?than nasopharyngeal swab, presence of viral mutation(s) within the ?areas targeted by this assay, and inadequate number of viral ?copies(<138 copies/mL). A negative result must be combined with ?clinical observations, patient history, and epidemiological ?information. The expected result is Negative. ? ?Fact Sheet for Patients:  ?BloggerCourse.com ? ?Fact Sheet for Healthcare Providers:  ?SeriousBroker.it ? ?This test is no t yet approved or cleared by the [place] FDA and  ?has been authorized for detection and/or diagnosis of SARS-CoV-2 by ?FDA under an Emergency Use Authorization (EUA). This EUA will remain  ?in effect (meaning this test can be used) for the duration of the ?COVID-19 declaration under Section 564(b)(1) of the Act, 21 ?U.S.C.section 360bbb-3(b)(1), unless the authorization is terminated  ?or revoked sooner.  ? ? ?  ? Influenza A by PCR NEGATIVE NEGATIVE Final  ? Influenza B by PCR NEGATIVE NEGATIVE Final  ?  Comment: (NOTE) ?The Xpert Xpress SARS-CoV-2/FLU/RSV plus assay is intended as an aid ?in the diagnosis of influenza from Nasopharyngeal swab specimens and ?should  not be used as a sole basis for treatment. Nasal washings and ?aspirates are unacceptable for Xpert Xpress SARS-CoV-2/FLU/RSV ?testing. ? ?Fact Sheet for Patients: ?BloggerCourse.comhttps://www.fda.gov/media/152166/download ? ?Fact Sheet for Healthcare Providers: ?SeriousBroker.ithttps://www.fda.gov/media/152162/download ? ?This test is not yet approved or cleared by the [place] FDA and ?has been authorized for detection and/or diagnosis of SARS-CoV-2 by ?FDA under an Emergency Use Authorization (EUA). This EUA will remain ?in effect (meaning this test can be used) for the duration of the ?COVID-19 declaration under Section 564(b)(1) of the Act, 21 U.S.C. ?section 360bbb-3(b)(1), unless the authorization is terminated or ?revoked. ? ?Performed at [facility], [address] [place], ?[place] [id number] ?  ?  ? ? ? ? ? ?Radiology Studies: ?DG Chest Port 1 View ? ?Result Date: 10/04/2021 ?CLINICAL DATA:  Shortness of breath with a history of COPD. EXAM: PORTABLE CHEST 1 VIE

## 2021-10-06 ENCOUNTER — Inpatient Hospital Stay (HOSPITAL_COMMUNITY)
Admit: 2021-10-06 | Discharge: 2021-10-06 | Disposition: A | Discharge status: Home / Self Care | Payer: Medicare Other | Attending: Internal Medicine | Admitting: Internal Medicine

## 2021-10-06 ENCOUNTER — Other Ambulatory Visit: Payer: Self-pay

## 2021-10-06 DIAGNOSIS — E43 Unspecified severe protein-calorie malnutrition: Secondary | ICD-10-CM | POA: Insufficient documentation

## 2021-10-06 DIAGNOSIS — I5031 Acute diastolic (congestive) heart failure: Secondary | ICD-10-CM

## 2021-10-06 DIAGNOSIS — J441 Chronic obstructive pulmonary disease with (acute) exacerbation: Secondary | ICD-10-CM | POA: Diagnosis not present

## 2021-10-06 LAB — ECHOCARDIOGRAM COMPLETE
AR max vel: 2.74 cm2
AV Area VTI: 3.13 cm2
AV Area mean vel: 2.76 cm2
AV Mean grad: 2 mmHg
AV Peak grad: 4.6 mmHg
Ao pk vel: 1.07 m/s
Area-P 1/2: 4.89 cm2
Height: 71 in
MV VTI: 2.63 cm2
S' Lateral: 2.39 cm
Weight: 2021.18 oz

## 2021-10-06 MED ORDER — PREDNISONE 20 MG PO TABS
40.0000 mg | ORAL_TABLET | Freq: Every day | ORAL | 0 refills | Status: AC
  Filled 2021-10-06 (×2): qty 8, 4d supply, fill #0

## 2021-10-06 MED ORDER — PREDNISONE 20 MG PO TABS: 40.0000 mg | ORAL_TABLET | Freq: Every day | ORAL | 0 refills | Status: DC

## 2021-10-06 MED ORDER — DOXYCYCLINE HYCLATE 100 MG PO TABS: 100.0000 mg | ORAL_TABLET | Freq: Two times a day (BID) | ORAL | 0 refills | Status: DC

## 2021-10-06 MED ORDER — BENZONATATE 100 MG PO CAPS
100.0000 mg | ORAL_CAPSULE | Freq: Three times a day (TID) | ORAL | 0 refills | Status: DC | PRN
Start: 2021-10-06 — End: 2022-03-08
  Filled 2021-10-06: qty 30, 10d supply, fill #0

## 2021-10-06 MED ORDER — ALBUTEROL SULFATE (2.5 MG/3ML) 0.083% IN NEBU: 2.5000 mg | INHALATION_SOLUTION | RESPIRATORY_TRACT | 1 refills | Status: DC | PRN

## 2021-10-06 MED ORDER — ALBUTEROL SULFATE HFA 108 (90 BASE) MCG/ACT IN AERS
2.0000 | INHALATION_SPRAY | Freq: Four times a day (QID) | RESPIRATORY_TRACT | 1 refills | Status: DC | PRN
  Filled 2021-10-06: qty 6.7, 25d supply, fill #0

## 2021-10-06 MED ORDER — ALBUTEROL SULFATE HFA 108 (90 BASE) MCG/ACT IN AERS
2.0000 | INHALATION_SPRAY | Freq: Four times a day (QID) | RESPIRATORY_TRACT | 1 refills | Status: DC | PRN
Start: 2021-10-06 — End: 2021-10-06

## 2021-10-06 MED ORDER — BENZONATATE 100 MG PO CAPS: 100.0000 mg | ORAL_CAPSULE | Freq: Three times a day (TID) | ORAL | 0 refills | Status: DC | PRN

## 2021-10-06 MED ORDER — ALBUTEROL SULFATE (2.5 MG/3ML) 0.083% IN NEBU
2.5000 mg | INHALATION_SOLUTION | RESPIRATORY_TRACT | 1 refills | Status: DC | PRN
  Filled 2021-10-06 (×2): qty 90, 5d supply, fill #0

## 2021-10-06 MED ORDER — DOXYCYCLINE HYCLATE 100 MG PO TABS
100.0000 mg | ORAL_TABLET | Freq: Two times a day (BID) | ORAL | 0 refills | Status: AC
  Filled 2021-10-06: qty 6, 3d supply, fill #0

## 2021-10-06 NOTE — Progress Notes (Addendum)
Discharge instructions explained to pt/ verbalized an understanding/ iv removed/ TOC to assist with PCP and medications / filled RX to be delivered to pts room / taxi voucher provided/ will transport off unit via wheelchair ?

## 2021-10-06 NOTE — Discharge Summary (Signed)
Physician Discharge Summary  ?Guy Bowen TKW:[id number] DOB: 15-Jan-1956 DOA: 10/04/2021 ? ?PCP: Pcp, No ? ?Admit date: 10/04/2021 ?Discharge date: 10/06/2021 ? ?Admitted From: Home ?Disposition: Home ? ?Recommendations for Outpatient Follow-up:  ?Follow up with PCP in 1-2 weeks ?Establish care with pulmonology ? ?Home Health: No ?Equipment/Devices: None ? ?Discharge Condition: Stable ?CODE STATUS: Full ?Diet recommendation: Regular ? ?Brief/Interim Summary: ? ? 66 y.o. male with medical history significant coronary artery disease status post CABG, nicotine dependence, COPD who presents to the ER for evaluation of worsening shortness of breath from his baseline associated with a cough productive of yellow phlegm. ?Patient states that he has had exertional shortness of breath for several months but on the morning of admission he developed shortness of breath at rest and did not improve after he used his nebulizer.  He called EMS and was noted to have trouble speaking in complete sentences.  Patient was unable to tolerate CPAP in the field and was placed on a nonrebreather mask.  He was transitioned to high flow oxygen in the ER ?He denies having any chest pain, no fever, no chills, no nausea, no palpitations, no diaphoresis, no changes in his bowel habits, no urinary symptoms, no blurred vision or focal deficit. ? ?Respiratory status stabilized.  Patient weaned to room air.  Stable at time of discharge.  Will establish care with PCP.  Referral to [facility] pulmonology at time of discharge.  On discharge will recommend albuterol MDI, albuterol nebulizer, prednisone 40 mg a day x4 additional days, Tessalon Perles as needed ? ? ?Discharge Diagnoses:  ?Principal Problem: ?  COPD with acute exacerbation (HCC) ?Active Problems: ?  CAD (coronary artery disease) ?  Nicotine dependence ?  Protein-calorie malnutrition, severe ?* COPD with acute exacerbation (HCC) ?Acute hypoxic respiratory failure secondary to above ?Patient with a  history of COPD who presents for worsening shortness of breath from his baseline associated with a cough productive of brown phlegm. ?He had increased work of breathing upon arrival to the ER  ?Initially required high flow oxygen ?Plan: ?Weaned to room air.  Discharge home.  Continue steroids for additional 4 days.  Prednisone 40 mg a day.  As needed albuterol MDI and nebulizer prescribed.  Empiric doxycycline, complete 5-day course.  3 times daily Tessalon Perles prescribed.  Patient will establish care with PCP and strongly recommended to establish pulmonology care with [facility] pulmonology.   ? ?CAD (coronary artery disease) ?Status post CABG ?Stable ?  ?Nicotine dependence ?Patient admits to continued nicotine use but has cut back. ?Offer nicotine patch, patient declined ?  ? ? ?Discharge Instructions ? ?Discharge Instructions   ? ? Diet - low sodium heart healthy   Complete by: As directed ?  ? Increase activity slowly   Complete by: As directed ?  ? ?  ? ?Allergies as of 10/06/2021   ? ?   Reactions  ? Penicillins Anaphylaxis  ? Codeine   ? ?  ? ?  ?Medication List  ?  ? ?TAKE these medications   ? ?albuterol 108 (90 Base) MCG/ACT inhaler ?Commonly known as: VENTOLIN HFA ?Inhale 2 puffs into the lungs every 6 (six) hours as needed for wheezing. ?What changed: Another medication with the same name was added. Make sure you understand how and when to take each. ?  ?albuterol (2.5 MG/3ML) 0.083% nebulizer solution ?Commonly known as: PROVENTIL ?Take 3 mLs (2.5 mg total) by nebulization every 4 (four) hours as needed for wheezing or shortness of breath. ?What changed:  You were already taking a medication with the same name, and this prescription was added. Make sure you understand how and when to take each. ?  ?aspirin 81 MG chewable tablet ?Chew 81 mg by mouth daily. ?  ?benzonatate 100 MG capsule ?Commonly known as: LawyerTessalon Perles ?Take 1 capsule (100 mg total) by mouth 3 (three) times daily as needed for cough. ?   ?doxycycline 100 MG tablet ?Commonly known as: VIBRA-TABS ?Take 1 tablet (100 mg total) by mouth every 12 (twelve) hours for 3 days. ?  ?predniSONE 20 MG tablet ?Commonly known as: DELTASONE ?Take 2 tablets (40 mg total) by mouth daily with breakfast for 4 days. ?Start taking on: October 07, 2021 ?  ? ?  ? ? Follow-up Information   ? ? Vida RiggerAleskerov, Fuad, MD. Schedule an appointment as soon as possible for a visit in 2 week(s).   ?Specialty: Pulmonary Disease ?Why: Call [facility] pulmonology [place] office.  Make appointment with first available provider ?Contact information: ?[address] ?[place] [address] ?[phone] ? ? ?  ?  ? ?  ?  ? ?  ? ?Allergies  ?Allergen Reactions  ? Penicillins Anaphylaxis  ? Codeine   ? ? ?Consultations: ?None ? ? ?Procedures/Studies: ?DG Chest Port 1 View ? ?Result Date: 10/04/2021 ?CLINICAL DATA:  Shortness of breath with a history of COPD. EXAM: PORTABLE CHEST 1 VIEW COMPARISON:  None. FINDINGS: There is overlying monitor wiring. There are intact sternotomy sutures with CABG changes. Heart size and vasculature are normal apart from mild aortic atherosclerosis. The lungs are emphysematous but clear. The mediastinum is normally outlined. Mild osteopenia. IMPRESSION: No evidence of acute chest disease. COPD. Prior CABG. Aortic atherosclerosis. Electronically Signed   By: Almira BarKeith  Chesser M.D.   On: 10/04/2021 06:13   ? ? ? ?Subjective: ?Seen and examined on time of discharge.  Stable no distress.  Weaned to room air.  Lung sounds improved.  Stable for discharge home ? ?Discharge Exam: ?Vitals:  ? 10/06/21 0818 10/06/21 1228  ?BP: (!) 143/76 (!) 110/58  ?Pulse: 83 (!) 103  ?Resp: 18 19  ?Temp: (!) 97.5 ?F (36.4 ?C)   ?SpO2: 100% 93%  ? ?Vitals:  ? 10/06/21 0442 10/06/21 0810 10/06/21 0818 10/06/21 1228  ?BP: 121/69  (!) 143/76 (!) 110/58  ?Pulse: 63  83 (!) 103  ?Resp: 16  18 19   ?Temp: 98.1 ?F (36.7 ?C)  (!) 97.5 ?F (36.4 ?C)   ?TempSrc:      ?SpO2: 98% 100% 100% 93%  ?Weight:       ?Height:      ? ? ?General: Pt is alert, awake, not in acute distress ?Cardiovascular: RRR, S1/S2 +, no rubs, no gallops ?Respiratory: CTA bilaterally, no wheezing, no rhonchi ?Abdominal: Soft, NT, ND, bowel sounds + ?Extremities: no edema, no cyanosis ? ? ? ?The results of significant diagnostics from this hospitalization (including imaging, microbiology, ancillary and laboratory) are listed below for reference.   ? ? ?Microbiology: ?Recent Results (from the past 240 hour(s))  ?Resp Panel by RT-PCR (Flu A&B, Covid) Nasopharyngeal Swab     Status: None  ? Collection Time: 10/04/21  6:04 AM  ? Specimen: Nasopharyngeal Swab; Nasopharyngeal(NP) swabs in vial transport medium  ?Result Value Ref Range Status  ? SARS Coronavirus 2 by RT PCR NEGATIVE NEGATIVE Final  ?  Comment: (NOTE) ?SARS-CoV-2 target nucleic acids are NOT DETECTED. ? ?The SARS-CoV-2 RNA is generally detectable in upper respiratory ?specimens during the acute phase of infection. The  lowest ?concentration of SARS-CoV-2 viral copies this assay can detect is ?138 copies/mL. A negative result does not preclude SARS-Cov-2 ?infection and should not be used as the sole basis for treatment or ?other patient management decisions. A negative result may occur with  ?improper specimen collection/handling, submission of specimen other ?than nasopharyngeal swab, presence of viral mutation(s) within the ?areas targeted by this assay, and inadequate number of viral ?copies(<138 copies/mL). A negative result must be combined with ?clinical observations, patient history, and epidemiological ?information. The expected result is Negative. ? ?Fact Sheet for Patients:  ?BloggerCourse.com ? ?Fact Sheet for Healthcare Providers:  ?SeriousBroker.it ? ?This test is no t yet approved or cleared by the [place] FDA and  ?has been authorized for detection and/or diagnosis of SARS-CoV-2 by ?FDA under an Emergency Use  Authorization (EUA). This EUA will remain  ?in effect (meaning this test can be used) for the duration of the ?COVID-19 declaration under Section 564(b)(1) of the Act, 21 ?U.S.C.section 360bbb-3(b)(1), unless

## 2021-10-06 NOTE — Progress Notes (Signed)
*  PRELIMINARY RESULTS* ?Echocardiogram ?2D Echocardiogram has been performed. ? ?Guy Bowen [name] ?10/06/2021, 1:58 PM ?

## 2021-10-06 NOTE — Progress Notes (Signed)
TOC consulted for PCP needs.  ? ?Patient has insurance.  ? ?TOC talked to patient, he confirms he is insured. Reports he has not called around to establish PCP but plans to after discharge. CSW informed patient that typically we are consulted for patients without insurance to find a PCP, however since patient is insured it is up to him to establish with a local provider depending on his preferences. He reports he is aware.  ? Angeline Slim, [place] ?[phone] ? ?

## 2022-03-07 NOTE — Progress Notes (Unsigned)
New patient visit  I,Guy Bowen,acting as a scribe for [employer], MD.,have documented all relevant documentation on the behalf of Guy Ramp, MD,as directed by  Guy Ramp, MD while in the presence of Guy Ramp, MD.   Patient: Guy Bowen   DOB: [birth year]   66 y.o. Male  MRN: [id number] Visit Date: 03/08/2022  Today's healthcare provider: Ronnald Ramp, MD   Chief Complaint  Patient presents with   Establish Care   Subjective    Guy Bowen is a 66 y.o. male who presents today as a new patient to establish care.   Introduced role as primary care physician and reviewed medical hx and medications.   COPD  Patient reports having chronic productive cough that occurs throughout the day  He reports using albuterol and OTC mist (primateen mist) Reports having spirometry  >5 years ago  Reports doing nebulizer in the AM and then 2-3 times during the day uses the inhaler  He reports that he is chronically wheezing  Current tobacco use, weaned form 2PPD to 1/2 pack per week, uses lozenges to help with decreasing smoking  Does not use supplemental oxygen   Tobacco Use  Reports that he is trying to quit smoking  He uses 1/2 pack in one week  He uses the lozenges to help with NRT  He has been smoking >40 years, 2 PPD for ~15 years  He reports having prior chest CT scans to screen for lung CA (estimates last was in in April or May)   CAD Patient had triple bypass in 2021 Follows with [facility] for cardiology care, last visit was August  Reports adherence to ASA 81mg , crestor 10mg  and aldactone Denies Chest pain today     Weak Urinary stream  Reports previously on flomax in the 1990s and felt dizzy so this medication was stopped  Reports hx of vasectomy ~1988  Denies urinary frequency  Denies dysuria   Testicular Pain Hx of strangulated hernia for which he underwent surgical  repair for in 1970s He reports that during that procedure, 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  Past Medical History:  Diagnosis Date   Allergy    Arthritis    Asthma    COPD (chronic obstructive pulmonary disease) (HCC)    Heart murmur    Past Surgical History:  Procedure Laterality Date   CORONARY ARTERY BYPASS GRAFT     HERNIA REPAIR     VASECTOMY     Family Status  Relation Name Status   Mother  (Not Specified)   Father  (Not Specified)   MGM  (Not Specified)   Family History  Problem Relation Age of Onset   Emphysema Mother    Heart failure Father    Cancer Maternal Grandmother    Social History   Socioeconomic History   Marital status: Widowed    Spouse name: Not on file   Number of children: Not on file   Years of education: Not on file   Highest education level: Not on file  Occupational History   Not on file  Tobacco Use   Smoking  status: Former    Types: Cigarettes   Smokeless tobacco: Never  Substance and Sexual Activity   Alcohol use: Not on file   Drug use: Never   Sexual activity: Never  Other Topics Concern   Not on file  Social History Narrative   ** Merged History Encounter **       Social Determinants of Health   Financial Resource Strain: Not on file  Food Insecurity: Not on file  Transportation Needs: Not on file  Physical Activity: Not on file  Stress: Not on file  Social Connections: Not on file   Outpatient Medications Prior to Visit  Medication Sig   albuterol (PROVENTIL HFA) 108 (90 Base) MCG/ACT inhaler Inhale 2 puffs into the lungs once every 6 (six) hours as needed for wheezing.   albuterol (PROVENTIL)  (2.5 MG/3ML) 0.083% nebulizer solution Inhale 3 mLs (2.5 mg total) by nebulization once every 4 (four) hours as needed for wheezing or shortness of breath.   aspirin 81 MG chewable tablet Chew 81 mg by mouth daily.   rosuvastatin (CRESTOR) 10 MG tablet Take 10 mg by mouth daily.   spironolactone (ALDACTONE) 25 MG tablet Take 12.5 mg by mouth daily.   [DISCONTINUED] benzonatate (TESSALON PERLES) 100 MG capsule Take 1 capsule (100 mg total) by mouth 3 (three) times daily as needed for cough.   No facility-administered medications prior to visit.   Allergies  Allergen Reactions   Penicillins Anaphylaxis   Codeine      There is no immunization history on file for this patient.  Health Maintenance  Topic Date Due   COVID-19 Vaccine (1) Never done   Pneumonia Vaccine 38+ Years old (1 - PCV) Never done   Hepatitis C Screening  Never done   TETANUS/TDAP  Never done   COLONOSCOPY (Pts 45-79yrs Insurance coverage will need to be confirmed)  Never done   Zoster Vaccines- Shingrix (1 of 2) Never done   INFLUENZA VACCINE  Never done   HPV VACCINES  Aged Out    Patient Care Team: Pcp, No as PCP - General  Review of Systems  HENT:  Positive for congestion, dental problem, sinus pressure and tinnitus.   Eyes:  Positive for visual disturbance.  Cardiovascular:  Positive for palpitations ("rare").  Endocrine: Positive for polyuria.  Genitourinary:  Positive for testicular pain. Negative for urgency.  Musculoskeletal:  Positive for neck stiffness.  Neurological:  Positive for weakness.  All other systems reviewed and are negative.    Objective    BP 139/86 (BP Location: Left Arm, Patient Position: Sitting, Cuff Size: Normal)   Pulse 93   Temp (!) 97.5 F (36.4 C) (Oral)   Resp 16   Ht 5\' 11"  (1.803 m)   Wt 135 lb 1.6 oz (61.3 kg)   SpO2 96%   BMI 18.84 kg/m    Physical Exam Exam conducted with a chaperone present.  Eyes:     Conjunctiva/sclera: Conjunctivae normal.   Cardiovascular:     Rate and Rhythm: Normal rate and regular rhythm.  Pulmonary:     Effort: Pulmonary effort is normal. No respiratory distress.     Breath sounds: No stridor. No wheezing, rhonchi or rales.  Abdominal:     General: Bowel sounds are normal. There is no distension.     Palpations: Abdomen is soft.     Tenderness: There is no abdominal tenderness.  Genitourinary:    Penis: Normal. No erythema.      Testes:  Right: Tenderness or swelling not present.        Left: Tenderness present. 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     Comments: High riding left teste, +tenderness on left with erythematous appearance compared to right  Musculoskeletal:        General: No signs of injury.     Right lower leg: No edema.     Left lower leg: No edema.     Comments: Ambulates with cane assistive device   Skin:    General: Skin is dry.  Neurological:     Mental Status: He is oriented to person, place, and time.     Depression Screen    03/08/2022    1:57 PM  PHQ 2/9 Scores  PHQ - 2 Score 0  PHQ- 9 Score 3   Results for orders placed or performed in visit on 03/08/22  POCT urinalysis dipstick  Result Value Ref Range   Color, UA Yellow    Clarity, UA Clear    Glucose, UA Negative Negative   Bilirubin, UA Negative    Ketones, UA Negative    Spec Grav, UA <=1.005 (A) 1.010 - 1.025   Blood, UA Negative    pH, UA 7.5 5.0 - 8.0   Protein, UA Negative Negative   Urobilinogen, UA 0.2 0.2 or 1.0 E.U./dL   Nitrite, UA Negative    Leukocytes, UA Negative Negative   Appearance     Odor      Assessment & Plan      Problem List Items Addressed This Visit       Cardiovascular and Mediastinum   CAD (coronary artery disease)    Chronic, stable  Follows with cardiology, last appt in aug 2023 Patient adherent to regimen of ASA 81mg , crestor 10mg , aldactone 12.5mg  Patient to continue current medications       Relevant Medications   rosuvastatin (CRESTOR) 10 MG tablet    spironolactone (ALDACTONE) 25 MG tablet     Respiratory   COPD without exacerbation (HCC)    Chronic, uncontrolled  Will start Anoro ellipta daily Recommended albuterol as rescue inhaler and the Anoro for maintenance therapy  Patient reports prior spirometry testing  He is not in respiratory distress today and has no wheezing nor crackles on exam, equal breath sounds bilaterally  Recommend follow up in 2-3 weeks       Relevant Medications   umeclidinium-vilanterol (ANORO ELLIPTA) 62.5-25 MCG/ACT AEPB     Other   Nicotine dependence    Chronic, actively weaning  Weaned to 1/2 pack per week from 2PPD  Patient to continue nicotine lozenges to assist with smoking cessation  Congratulated patient on weaning so far independently        Weak urine stream - Primary    Chronic issue  Will refer to urology  U/A did not show signs of UTI today       Relevant Orders   POCT urinalysis dipstick (Completed)   Ambulatory referral to Urology   Testicular pain, left    Chronic  Hx of surgical intervention during inguinal hernia repair and problem did not resolve as expected  High riding left teste on exam with tenderness  Collected U/A that did not show signs of infection  Pain is mostly positional, per patient  Will refer to urology       Relevant Orders   Ambulatory referral to Urology     Return in about 2 weeks (around 03/22/2022) for COPD med f/u .     The  entirety of the information documented in the History of Present Illness, Review of Systems and Physical Exam were personally obtained by me, Skyelar Halliday Simmons-Robinson. Portions of this information were initially documented by the CMA and reviewed by me for thoroughness and accuracy.     Guy Ramp, MD  [facility] [phone] (phone) [phone] (fax)  [facility]

## 2022-03-08 ENCOUNTER — Encounter: Payer: Self-pay | Admitting: Family Medicine

## 2022-03-08 ENCOUNTER — Ambulatory Visit (INDEPENDENT_AMBULATORY_CARE_PROVIDER_SITE_OTHER): Payer: Medicare Other | Admitting: Family Medicine

## 2022-03-08 VITALS — BP 139/86 | HR 93 | Temp 97.5°F | Resp 16 | Ht 71.0 in | Wt 135.1 lb

## 2022-03-08 DIAGNOSIS — R3912 Poor urinary stream: Secondary | ICD-10-CM | POA: Diagnosis not present

## 2022-03-08 DIAGNOSIS — I2510 Atherosclerotic heart disease of native coronary artery without angina pectoris: Secondary | ICD-10-CM

## 2022-03-08 DIAGNOSIS — F17218 Nicotine dependence, cigarettes, with other nicotine-induced disorders: Secondary | ICD-10-CM

## 2022-03-08 DIAGNOSIS — N50812 Left testicular pain: Secondary | ICD-10-CM

## 2022-03-08 DIAGNOSIS — J449 Chronic obstructive pulmonary disease, unspecified: Secondary | ICD-10-CM

## 2022-03-08 LAB — POCT URINALYSIS DIPSTICK
Bilirubin, UA: NEGATIVE
Blood, UA: NEGATIVE
Glucose, UA: NEGATIVE
Ketones, UA: NEGATIVE
Leukocytes, UA: NEGATIVE
Nitrite, UA: NEGATIVE
Protein, UA: NEGATIVE
Spec Grav, UA: <=1.005 — AB (ref 1.010–1.025)
Urobilinogen, UA: 0.2 E.U./dL
pH, UA: 7.5 (ref 5.0–8.0)

## 2022-03-08 MED ORDER — UMECLIDINIUM-VILANTEROL 62.5-25 MCG/ACT IN AEPB: 1.0000 | INHALATION_SPRAY | Freq: Every day | RESPIRATORY_TRACT | 3 refills | Status: DC

## 2022-03-08 NOTE — Assessment & Plan Note (Signed)
Chronic, stable  Follows with cardiology, last appt in aug 2023 Patient adherent to regimen of ASA 81mg , crestor 10mg , aldactone 12.5mg  Patient to continue current medications

## 2022-03-08 NOTE — Assessment & Plan Note (Signed)
Chronic issue  Will refer to urology  U/A did not show signs of UTI today

## 2022-03-08 NOTE — Assessment & Plan Note (Signed)
Chronic, actively weaning  Weaned to 1/2 pack per week from 2PPD  Patient to continue nicotine lozenges to assist with smoking cessation  Congratulated patient on weaning so far independently

## 2022-03-08 NOTE — Assessment & Plan Note (Signed)
Chronic, uncontrolled  Will start Anoro ellipta daily Recommended albuterol as rescue inhaler and the Anoro for maintenance therapy  Patient reports prior spirometry testing  He is not in respiratory distress today and has no wheezing nor crackles on exam, equal breath sounds bilaterally  Recommend follow up in 2-3 weeks

## 2022-03-08 NOTE — Assessment & Plan Note (Signed)
Chronic  Hx of surgical intervention during inguinal hernia repair and problem did not resolve as expected  High riding left teste on exam with tenderness  Collected U/A that did not show signs of infection  Pain is mostly positional, per patient  Will refer to urology

## 2022-03-10 ENCOUNTER — Ambulatory Visit: Payer: Medicare Other | Admitting: Family Medicine

## 2022-03-21 NOTE — Progress Notes (Signed)
Established patient visit  I,[name],acting as a scribe for [employer], MD.,have documented all relevant documentation on the behalf of Ronnald Ramp, MD,as directed by  Ronnald Ramp, MD while in the presence of Ronnald Ramp, MD.   Patient: Guy Bowen   DOB: 10/08/1955   66 y.o. Male  MRN: [id number] Visit Date: 03/22/2022  Today's healthcare provider: Ronnald Ramp, MD   Chief Complaint  Patient presents with   Follow-up COPD   Subjective    HPI  COPD, Follow up  He was last seen for this 2-3 weeks ago. Changes made include Anoro ellipta daily. Patient reports that he didn't get this prescription.   he uses rescue inhaler 1 per months. He IS experiencing fatigue and sob. He is NOT experiencing weight increase, weight loss, chills, fever, or increased sputum.  Nicotine Dependence  He reports that he smoked one cigarette this AM and prior to that has not smoked in three days  He is continuing to wean down on his cigarette smoking to help COPD management  He has used lozenges to help with NRT  -----------------------------------------------------------------------------------------   Medications: Outpatient Medications Prior to Visit  Medication Sig   albuterol (PROVENTIL HFA) 108 (90 Base) MCG/ACT inhaler Inhale 2 puffs into the lungs once every 6 (six) hours as needed for wheezing.   albuterol (PROVENTIL) (2.5 MG/3ML) 0.083% nebulizer solution Inhale 3 mLs (2.5 mg total) by nebulization once every 4 (four) hours as needed for wheezing or shortness of breath.   aspirin 81 MG chewable tablet Chew 81 mg by mouth daily.   gabapentin (NEURONTIN) 100 MG capsule Take 2 capsules (200 mg total) by mouth 2 (two) times daily.   rosuvastatin (CRESTOR) 10 MG tablet Take 10 mg by mouth daily.   silodosin (RAPAFLO) 8 MG CAPS capsule Take 1 capsule (8 mg total) by mouth daily with breakfast.   spironolactone  (ALDACTONE) 25 MG tablet Take 12.5 mg by mouth daily.   nicotine polacrilex (COMMIT) 4 MG lozenge Place inside cheek.   [DISCONTINUED] umeclidinium-vilanterol (ANORO ELLIPTA) 62.5-25 MCG/ACT AEPB Inhale 1 puff into the lungs daily at 6 (six) AM. (Patient not taking: Reported on 03/22/2022)   No facility-administered medications prior to visit.    Review of Systems     Objective    BP (!) 149/88 (BP Location: Left Arm, Patient Position: Sitting, Cuff Size: Normal)   Pulse 78   Temp 97.9 F (36.6 C) (Oral)   Resp 16   Wt 136 lb 3.2 oz (61.8 kg)   SpO2 98%   BMI 19.00 kg/m    Physical Exam Constitutional:      General: He is not in acute distress.    Appearance: Normal appearance. He is not ill-appearing, toxic-appearing or diaphoretic.  Cardiovascular:     Rate and Rhythm: Normal rate and regular rhythm.  Pulmonary:     Effort: Pulmonary effort is normal. No respiratory distress.     Breath sounds: No wheezing or rhonchi.     Comments: Prolonged expiratory phase  No crackles on exam  Neurological:     Mental Status: He is alert.       Results for orders placed or performed in visit on 03/22/22  Bladder Scan (Post Void Residual) in office  Result Value Ref Range   Scan Result 1     Assessment & Plan     Problem List Items Addressed This Visit       Respiratory   COPD without  exacerbation (HCC) - Primary    Chronic, stable  No acute exacerbation at this time  Unable to fill anoro ellipta due to insurance coverage  He does reports experiencing SOB and some fatigue with ambulating long distances  Will start spiriva 2 puffs daily, 2.24mcg  Follow up for COPD in one month       Relevant Medications   nicotine polacrilex (COMMIT) 4 MG lozenge   Tiotropium Bromide Monohydrate (SPIRIVA RESPIMAT) 2.5 MCG/ACT AERS     Other   Nicotine dependence    Chronic, weaning cigarettes  He has been able to decrease cigarettes smoked per day using nicorette lozenges   Recommended continued efforts for cessation to improve lung response given hx of COPD        Relevant Medications   nicotine polacrilex (COMMIT) 4 MG lozenge     Return in about 1 month (around 04/21/2022) for COPD.      I, Ronnald Ramp, MD, have reviewed all documentation for this visit. The documentation on 03/22/22 for the exam, diagnosis, procedures, and orders are all accurate and complete.    Ronnald Ramp, MD  [facility] [phone] (phone) [phone] (fax)  [facility]

## 2022-03-22 ENCOUNTER — Encounter: Payer: Self-pay | Admitting: Family Medicine

## 2022-03-22 ENCOUNTER — Encounter: Payer: Self-pay | Admitting: Urology

## 2022-03-22 ENCOUNTER — Ambulatory Visit (INDEPENDENT_AMBULATORY_CARE_PROVIDER_SITE_OTHER): Payer: Medicare Other | Admitting: Family Medicine

## 2022-03-22 ENCOUNTER — Ambulatory Visit: Payer: Medicare Other | Admitting: Urology

## 2022-03-22 VITALS — BP 144/86 | HR 92 | Ht 71.0 in | Wt 134.0 lb

## 2022-03-22 VITALS — BP 149/88 | HR 78 | Temp 97.9°F | Resp 16 | Wt 136.2 lb

## 2022-03-22 DIAGNOSIS — R3912 Poor urinary stream: Secondary | ICD-10-CM | POA: Diagnosis not present

## 2022-03-22 DIAGNOSIS — N500 Atrophy of testis: Secondary | ICD-10-CM | POA: Diagnosis not present

## 2022-03-22 DIAGNOSIS — N401 Enlarged prostate with lower urinary tract symptoms: Secondary | ICD-10-CM

## 2022-03-22 DIAGNOSIS — J449 Chronic obstructive pulmonary disease, unspecified: Secondary | ICD-10-CM

## 2022-03-22 DIAGNOSIS — F17218 Nicotine dependence, cigarettes, with other nicotine-induced disorders: Secondary | ICD-10-CM

## 2022-03-22 DIAGNOSIS — Q5522 Retractile testis: Secondary | ICD-10-CM

## 2022-03-22 LAB — MICROSCOPIC EXAMINATION

## 2022-03-22 LAB — URINALYSIS, COMPLETE
Bilirubin, UA: NEGATIVE
Glucose, UA: NEGATIVE
Ketones, UA: NEGATIVE
Leukocytes,UA: NEGATIVE
Nitrite, UA: NEGATIVE
Protein,UA: NEGATIVE
RBC, UA: NEGATIVE
Specific Gravity, UA: 1.02 (ref 1.005–1.030)
Urobilinogen, Ur: 0.2 mg/dL (ref 0.2–1.0)
pH, UA: 5.5 (ref 5.0–7.5)

## 2022-03-22 LAB — BLADDER SCAN AMB NON-IMAGING: Scan Result: 1

## 2022-03-22 MED ORDER — SILODOSIN 8 MG PO CAPS: 8.0000 mg | ORAL_CAPSULE | Freq: Every day | ORAL | 3 refills | Status: DC

## 2022-03-22 MED ORDER — GABAPENTIN 100 MG PO CAPS: 200.0000 mg | ORAL_CAPSULE | Freq: Two times a day (BID) | ORAL | 2 refills | Status: DC

## 2022-03-22 MED ORDER — SPIRIVA RESPIMAT 2.5 MCG/ACT IN AERS: 2.0000 | INHALATION_SPRAY | Freq: Every day | RESPIRATORY_TRACT | 6 refills | Status: DC

## 2022-03-22 NOTE — Progress Notes (Signed)
03/22/2022 8:28 AM   Guy Bowen 06-12-1956 [id number]  Referring provider: Ronnald Ramp, MD [address] [place],  [place] [address]  Chief Complaint  Patient presents with   Other    HPI: Guy Bowen is a 66 y.o. male referred for evaluation of lower urinary tract symptoms and chronic left hemiscrotal pain.  PCP visit 03/08/2022 to ED establish care 6 month history of bothersome lower urinary tract symptoms including a weak urinary stream.  Was given a trial of tamsulosin for symptoms in the 1990s which he could not tolerate secondary to orthostatic changes.  Symptoms have worsened recently. Denies dysuria, gross hematuria No flank or abdominal pain  Also complains of chronic left scrotal pain.  Had surgery for a strangulated inguinal hernia 1977 and postoperatively the testis was high riding.  He states it was told it would descend but never did.  Has intermittent pain worse with changes in position   PMH: Past Medical History:  Diagnosis Date   Allergy    Arthritis    Asthma    COPD (chronic obstructive pulmonary disease) (HCC)    Heart murmur     Surgical History: Past Surgical History:  Procedure Laterality Date   CORONARY ARTERY BYPASS GRAFT     HERNIA REPAIR     VASECTOMY      Home Medications:  Allergies as of 03/22/2022       Reactions   Penicillins Anaphylaxis   Codeine         Medication List        Accurate as of March 22, 2022  8:28 AM. If you have any questions, ask your nurse or doctor.          albuterol 108 (90 Base) MCG/ACT inhaler Commonly known as: Proventil HFA Inhale 2 puffs into the lungs once every 6 (six) hours as needed for wheezing.   albuterol (2.5 MG/3ML) 0.083% nebulizer solution Commonly known as: PROVENTIL Inhale 3 mLs (2.5 mg total) by nebulization once every 4 (four) hours as needed for wheezing or shortness of breath.   aspirin 81 MG chewable tablet Chew 81 mg by mouth  daily.   rosuvastatin 10 MG tablet Commonly known as: CRESTOR Take 10 mg by mouth daily.   spironolactone 25 MG tablet Commonly known as: ALDACTONE Take 12.5 mg by mouth daily.   umeclidinium-vilanterol 62.5-25 MCG/ACT Aepb Commonly known as: ANORO ELLIPTA Inhale 1 puff into the lungs daily at 6 (six) AM.        Allergies:  Allergies  Allergen Reactions   Penicillins Anaphylaxis   Codeine     Family History: Family History  Problem Relation Age of Onset   Emphysema Mother    Heart failure Father    Cancer Maternal Grandmother     Social History:  reports that he has quit smoking. His smoking use included cigarettes. He has never used smokeless tobacco. He reports that he does not use drugs. No history on file for alcohol use.   Physical Exam: BP (!) 144/86   Pulse 92   Ht 5\' 11"  (1.803 m)   Wt 134 lb (60.8 kg)   BMI 18.69 kg/m   Constitutional:  Alert and oriented, No acute distress. HEENT: [place] AT, moist mucus membranes.  Trachea midline, no masses. Cardiovascular: No clubbing, cyanosis, or edema. Respiratory: Normal respiratory effort, no increased work of breathing. GI: Abdomen is soft, nontender, nondistended, no abdominal masses GU: Phallus without lesions.  Right testis descended and palpably normal.  Atrophic left testis palpable in the left inguinal area just within the external ring.  Estimated volume ~ 5 cc.  Prostate 45 g, smooth without nodules Skin: No rashes, bruises or suspicious lesions. Neurologic: Grossly intact, no focal deficits, moving all 4 extremities. Psychiatric: Normal mood and affect.   Assessment & Plan:    1.  BPH with obstructive voiding symptoms PVR 1 cc Symptoms bothersome enough that he desires a trial of medical management.  Rx silodosin 8 mg sent to pharmacy  2.  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  He  was not interested in surgery at this time. Trial gabapentin 200 mg twice daily  Follow-up 2 months for symptom reassessment.   Abbie Sons, [place] [address], [place], [place] [address] [phone]

## 2022-03-22 NOTE — Assessment & Plan Note (Signed)
Chronic, weaning cigarettes  He has been able to decrease cigarettes smoked per day using nicorette lozenges  Recommended continued efforts for cessation to improve lung response given hx of COPD

## 2022-03-22 NOTE — Assessment & Plan Note (Signed)
Chronic, stable  No acute exacerbation at this time  Unable to fill anoro ellipta due to insurance coverage  He does reports experiencing SOB and some fatigue with ambulating long distances  Will start spiriva 2 puffs daily, 2.73mcg  Follow up for COPD in one month

## 2022-03-27 ENCOUNTER — Telehealth: Payer: Self-pay | Admitting: Family Medicine

## 2022-03-27 MED ORDER — BREZTRI AEROSPHERE 160-9-4.8 MCG/ACT IN AERO: 2.0000 | INHALATION_SPRAY | Freq: Two times a day (BID) | RESPIRATORY_TRACT | 11 refills | Status: DC

## 2022-03-27 NOTE — Telephone Encounter (Signed)
Patient came in and states that he can not afford the Tiotropium Bromide Monohydrate (SPIRIVA RESPIMAT) 2.5 MCG/ACT AERS that you prescribed.  He states that if is near $500.  He would like to know if there is an alternative medication that we can prescribe.

## 2022-03-27 NOTE — Telephone Encounter (Signed)
Patient unable to accept breztri sample due to insurance cost.   Recommended anoro ellipta. Will share with patient so that he can check insurance cost of inhaler.    Eulis Foster, MD  [facility]  [phone]

## 2022-03-27 NOTE — Telephone Encounter (Signed)
Pt would like someone to call him with what medication you decide to send in so he can check to see if he can afford it before you send it in.

## 2022-03-27 NOTE — Telephone Encounter (Signed)
Breztri sample given to patient  Guy Foster, MD  [facility]  [phone]

## 2022-04-21 ENCOUNTER — Ambulatory Visit (INDEPENDENT_AMBULATORY_CARE_PROVIDER_SITE_OTHER): Payer: Medicare Other | Admitting: Family Medicine

## 2022-04-21 ENCOUNTER — Other Ambulatory Visit: Payer: Self-pay

## 2022-04-21 ENCOUNTER — Encounter: Payer: Self-pay | Admitting: Family Medicine

## 2022-04-21 VITALS — BP 130/78 | HR 84 | Resp 16 | Wt 140.6 lb

## 2022-04-21 DIAGNOSIS — J449 Chronic obstructive pulmonary disease, unspecified: Secondary | ICD-10-CM

## 2022-04-21 MED ORDER — GUAIFENESIN ER 600 MG PO TB12
600.0000 mg | ORAL_TABLET | Freq: Two times a day (BID) | ORAL | 3 refills | Status: DC
  Filled 2022-04-21: qty 60, 30d supply, fill #0

## 2022-04-21 NOTE — Progress Notes (Unsigned)
      Established patient visit   Patient: Guy Bowen   DOB: [birth year]   66 y.o. Male  MRN: [id number] Visit Date: 04/21/2022  Today's healthcare provider: Eulis Foster, MD   No chief complaint on file.  Subjective    HPI  COPD, Follow up  He was last seen for this {NUMBERS 1-12:18279} {days/wks/mos/yrs:310907} ago. Changes made include ***.   He reports {excellent/good/fair/poor:19665} compliance with treatment. He {IS/IS WGN:56213086} having side effects. *** he uses rescue inhaler {NUMBERS 1-12:18279} per {days/wks/mos/yrs:310907}. He IS experiencing {Symptoms; copd:17792}. He is NOT experiencing {Symptoms; copd:17792:o}. he reports breathing is {improved/worse/unchanged:3041574}.  Pulmonary Functions Testing Results: testing recommended, will plan to refer for PFT  He reports often waking due to cough throughout the night and has a lot of phelgm in the AM, he is able to return to sleep after  -----------------------------------------------------------------------------------------   Medications: Outpatient Medications Prior to Visit  Medication Sig   albuterol (PROVENTIL HFA) 108 (90 Base) MCG/ACT inhaler Inhale 2 puffs into the lungs once every 6 (six) hours as needed for wheezing.   albuterol (PROVENTIL) (2.5 MG/3ML) 0.083% nebulizer solution Inhale 3 mLs (2.5 mg total) by nebulization once every 4 (four) hours as needed for wheezing or shortness of breath.   aspirin 81 MG chewable tablet Chew 81 mg by mouth daily.   Budeson-Glycopyrrol-Formoterol (BREZTRI AEROSPHERE) 160-9-4.8 MCG/ACT AERO Inhale 2 puffs into the lungs 2 (two) times daily.   gabapentin (NEURONTIN) 100 MG capsule Take 2 capsules (200 mg total) by mouth 2 (two) times daily.   nicotine polacrilex (COMMIT) 4 MG lozenge Place inside cheek.   rosuvastatin (CRESTOR) 10 MG tablet Take 10 mg by mouth daily.   silodosin (RAPAFLO) 8 MG CAPS capsule Take 1 capsule (8 mg total) by mouth daily  with breakfast.   spironolactone (ALDACTONE) 25 MG tablet Take 12.5 mg by mouth daily.   No facility-administered medications prior to visit.    Review of Systems  {Labs  Heme  Chem  Endocrine  Serology  Results Review (optional):23779}   Objective    There were no vitals taken for this visit. {Show previous vital signs (optional):23777}  Physical Exam  ***  No results found for any visits on 04/21/22.  Assessment & Plan     Problem List Items Addressed This Visit   None    No follow-ups on file.     I, Eulis Foster, MD, have reviewed all documentation for this visit. The documentation on 04/21/22 for the exam, diagnosis, procedures, and orders are all accurate and complete.    Eulis Foster, MD  [facility] [phone] (phone) [phone] (fax)  [place]

## 2022-04-23 NOTE — Assessment & Plan Note (Addendum)
Chronic, uncontrolled, stable  We have agreed for pulmonology referral in order to have UTD spirometry testing  Patient is using rescue albuterol inhaler three times daily for symptoms  He has productive cough that awakens him from sleep  He needs controller inhaler but choosing an agent with adequate insurance coverage and affordability has been challenging so patient has not been on recommended therapy  Will have him follow up after testing  Will have him trial guaifenesin 600mg  every 12 hours for cough  Recommended continued weaning from tobacco, patient is using lozenges for NRT, down to 3 cigarettes per day, encouraged complete cessation to help with pulmonary symptoms

## 2022-05-19 ENCOUNTER — Ambulatory Visit: Payer: Medicare Other | Admitting: Student in an Organized Health Care Education/Training Program

## 2022-05-19 ENCOUNTER — Encounter: Payer: Self-pay | Admitting: Student in an Organized Health Care Education/Training Program

## 2022-05-19 VITALS — BP 124/82 | HR 87 | Temp 98.0°F | Ht 71.0 in | Wt 138.0 lb

## 2022-05-19 DIAGNOSIS — R0602 Shortness of breath: Secondary | ICD-10-CM | POA: Diagnosis not present

## 2022-05-19 DIAGNOSIS — J449 Chronic obstructive pulmonary disease, unspecified: Secondary | ICD-10-CM

## 2022-05-19 MED ORDER — UMECLIDINIUM-VILANTEROL 62.5-25 MCG/ACT IN AEPB: 1.0000 | INHALATION_SPRAY | Freq: Every day | RESPIRATORY_TRACT | 11 refills | Status: DC

## 2022-05-19 MED ORDER — ALBUTEROL SULFATE HFA 108 (90 BASE) MCG/ACT IN AERS: 2.0000 | INHALATION_SPRAY | Freq: Four times a day (QID) | RESPIRATORY_TRACT | 1 refills | Status: DC | PRN

## 2022-05-19 MED ORDER — DESLORATADINE 5 MG PO TABS: 5.0000 mg | ORAL_TABLET | Freq: Every day | ORAL | 2 refills | Status: DC

## 2022-05-19 MED ORDER — FLUTICASONE PROPIONATE HFA 220 MCG/ACT IN AERO: 2.0000 | INHALATION_SPRAY | Freq: Two times a day (BID) | RESPIRATORY_TRACT | 11 refills | Status: DC

## 2022-05-19 NOTE — Progress Notes (Signed)
Synopsis: Referred in for shortness of breath by Guy Bowen, Makie*  Assessment & Plan:   #Shortness of breath #COPD #Tobacco Use Disorder  He is presenting for the evaluation of shortness of breath with significant past medical history of smoking concerning for COPD.  I will initiate my work-up with a pulmonary function test (Spirometry, lung volumes, DLCO) and start him on triple therapy with ICS/LABA/LAMA as below (Anoro and Flovent) in addition to as needed albuterol.  He does have historic eosinophilia which I will confirm by repeating the CBC - the eosinophilia was in the setting of his acute exacerbation.  I will also obtain an allergen panel to further investigate this. He could potentially have an asthma overlap syndrome which would be evident on pulmonary function testing.  Furthermore, he does have a significant past medical history of smoking and would benefit from referral for lung cancer screening.  The patient is interested and willing.  His risk is certainly increased secondary to his exposures (jet fuel during [place], asbestos from work).  Should there be an issue with lung cancer screening I will likely obtain a CT scan of the chest to rule out asbestos-related lung disease.  Patient was a heavy smoker and is currently smoking 1 or 2 cigarettes a day. He is using nicotine lozenges to aid in quitting. I counseled him on the importance of complete cessation.  - Ambulatory Referral for Lung Cancer Scre - Pulmonary Function Test [facility] Only; Future - CBC with Differential/Platelet - Allergen Panel (27) + IGE - desloratadine (CLARINEX) 5 MG tablet; Take 1 tablet (5 mg total) by mouth daily.  Dispense: 30 tablet; Refill: 2 - umeclidinium-vilanterol (ANORO ELLIPTA) 62.5-25 MCG/ACT AEPB; Inhale 1 puff into the lungs daily.  Dispense: 30 each; Refill: 11 - fluticasone (FLOVENT HFA) 220 MCG/ACT inhaler; Inhale 2 puffs into the lungs 2 (two) times daily.  Dispense: 1 each; Refill:  11 - albuterol (PROVENTIL HFA) 108 (90 Base) MCG/ACT inhaler; Inhale 2 puffs into the lungs once every 6 (six) hours as needed for wheezing.  Dispense: 6.7 g; Refill: 1   Return in about 3 months (around 08/19/2022).  I spent 65 minutes caring for this patient today, including preparing to see the patient, obtaining and/or reviewing separately obtained history, performing a medically appropriate examination and/or evaluation, counseling and educating the patient/family/caregiver, ordering medications, tests, or procedures, documenting clinical information in the electronic health record, and independently interpreting results (not separately reported/billed) and communicating results to the patient/family/caregiver  [name], MD [facility] 05/19/2022 3:22 PM    End of visit medications:  Meds ordered this encounter  Medications   desloratadine (CLARINEX) 5 MG tablet    Sig: Take 1 tablet (5 mg total) by mouth daily.    Dispense:  30 tablet    Refill:  2   umeclidinium-vilanterol (ANORO ELLIPTA) 62.5-25 MCG/ACT AEPB    Sig: Inhale 1 puff into the lungs daily.    Dispense:  30 each    Refill:  11   fluticasone (FLOVENT HFA) 220 MCG/ACT inhaler    Sig: Inhale 2 puffs into the lungs 2 (two) times daily.    Dispense:  1 each    Refill:  11   albuterol (PROVENTIL HFA) 108 (90 Base) MCG/ACT inhaler    Sig: Inhale 2 puffs into the lungs once every 6 (six) hours as needed for wheezing.    Dispense:  6.7 g    Refill:  1     Current Outpatient Medications:  albuterol (PROVENTIL) (2.5 MG/3ML) 0.083% nebulizer solution, Inhale 3 mLs (2.5 mg total) by nebulization once every 4 (four) hours as needed for wheezing or shortness of breath., Disp: 90 mL, Rfl: 1   aspirin 81 MG chewable tablet, Chew 81 mg by mouth daily., Disp: , Rfl:    desloratadine (CLARINEX) 5 MG tablet, Take 1 tablet (5 mg total) by mouth daily., Disp: 30 tablet, Rfl: 2   fluticasone (FLOVENT HFA)  220 MCG/ACT inhaler, Inhale 2 puffs into the lungs 2 (two) times daily., Disp: 1 each, Rfl: 11   guaiFENesin (MUCINEX) 600 MG 12 hr tablet, Take 1 tablet (600 mg total) by mouth 2 (two) times daily., Disp: 60 tablet, Rfl: 3   nicotine polacrilex (COMMIT) 4 MG lozenge, Place inside cheek., Disp: , Rfl:    rosuvastatin (CRESTOR) 10 MG tablet, Take 10 mg by mouth daily., Disp: , Rfl:    spironolactone (ALDACTONE) 25 MG tablet, Take 12.5 mg by mouth daily., Disp: , Rfl:    umeclidinium-vilanterol (ANORO ELLIPTA) 62.5-25 MCG/ACT AEPB, Inhale 1 puff into the lungs daily., Disp: 30 each, Rfl: 11   albuterol (PROVENTIL HFA) 108 (90 Base) MCG/ACT inhaler, Inhale 2 puffs into the lungs once every 6 (six) hours as needed for wheezing., Disp: 6.7 g, Rfl: 1   Subjective:   PATIENT ID: Guy Bowen GENDER: male DOB: 19-Jul-1955, MRN: [id number]  Chief Complaint  Patient presents with   Consult    SOB with exertion for bypass in 2021.No wheezing. Cough with clear or yellow sputum.    HPI  Guy Bowen is a pleasant 66 year old male patient presenting to clinic for the evaluation of shortness of breath.  Patient reports that he started noticing significant exertional dyspnea couple years ago after his open heart surgery.  He reports feeling short of breath with minimal exertion on flat surfaces.  He tells me that walking around the [employer] shop gets him significantly winded as do other activities of daily living.  He feels short of breath when cooking a meal for himself for example.  He is unable to go up a flight of stairs secondary to the significant shortness of breath.  Patient reports a chronic cough that is productive of whitish sputum that tints yellow.  He feels the cough is most productive in the morning but he continues to cough throughout the day and produce sputum.  He denies any hemoptysis, night sweats, fevers, chills, or weight loss.  He reports that he is always been thin, having lost a  good amount of weight when he was in his early 30s after being hospitalized. He was admitted in April of 2023 and treated for a COPD exacerbation. His eosinophil count was elevated to 1300 at that time. He is using albuterol nebulized daily in addition to a PRN epinephrine nebulizer, with some improvement.  He tells me that he was in [employer] with exposure to jet fuel's and potentially agent orange.  He has smoked 2 packs a day for 50 years, quit in 2021.  He does occasionally smoke a cigarette or 2 a day but has been using nicotine lozenges with significant improvement.  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  He does have some exposure to asbestos whereby he would have to scrape asbestos in order to reach areas to work with.  He did not deploy or remediate asbestos.  Ancillary information including prior medications, full medical/surgical/family/social histories, and PFTs (when available) are listed below and  have been reviewed.   Review of Systems  Constitutional:  Negative for chills, fever and weight loss.  Respiratory:  Positive for cough, sputum production, shortness of breath and wheezing. Negative for hemoptysis.   Cardiovascular:  Negative for chest pain.     Objective:   Vitals:   05/19/22 1357  BP: 124/82  Pulse: 87  Temp: 98 F (36.7 C)  SpO2: 98%  Weight: 138 lb (62.6 kg)  Height: 5\' 11"  (1.803 m)   98% on RA BMI Readings from Last 3 Encounters:  05/19/22 19.25 kg/m  04/21/22 19.61 kg/m  03/22/22 19.00 kg/m   Wt Readings from Last 3 Encounters:  05/19/22 138 lb (62.6 kg)  04/21/22 140 lb 9.6 oz (63.8 kg)  03/22/22 136 lb 3.2 oz (61.8 kg)    Physical Exam Constitutional:      General: He is not in acute distress.    Appearance: Normal appearance. He is not ill-appearing.  HENT:     Nose: Congestion present.     Mouth/Throat:     Mouth: Mucous membranes are moist.  Eyes:     Extraocular Movements:  Extraocular movements intact.  Cardiovascular:     Rate and Rhythm: Normal rate and regular rhythm.     Pulses: Normal pulses.     Heart sounds: Normal heart sounds.  Pulmonary:     Effort: Pulmonary effort is normal.     Breath sounds: Normal breath sounds.  Abdominal:     General: Abdomen is flat.     Palpations: Abdomen is soft.  Musculoskeletal:        General: Normal range of motion.     Cervical back: Neck supple.  Neurological:     General: No focal deficit present.     Mental Status: He is alert and oriented to person, place, and time. Mental status is at baseline.       Ancillary Information    Past Medical History:  Diagnosis Date   Allergy    Arthritis    Asthma    COPD (chronic obstructive pulmonary disease) (HCC)    Heart murmur      Family History  Problem Relation Age of Onset   Emphysema Mother    Heart failure Father    Cancer Maternal Grandmother      Past Surgical History:  Procedure Laterality Date   CORONARY ARTERY BYPASS GRAFT     HERNIA REPAIR     VASECTOMY      Social History   Socioeconomic History   Marital status: Widowed    Spouse name: Not on file   Number of children: Not on file   Years of education: Not on file   Highest education level: Not on file  Occupational History   Not on file  Tobacco Use   Smoking status: Former    Packs/day: 2.00    Years: 50.00    Total pack years: 100.00    Types: Cigarettes   Smokeless tobacco: Never   Tobacco comments:    1-2 cigarettes daily 05/19/2022  Substance and Sexual Activity   Alcohol use: Not on file   Drug use: Never   Sexual activity: Never  Other Topics Concern   Not on file  Social History Narrative   ** Merged History Encounter **       Social Determinants of Health   Financial Resource Strain: Not on file  Food Insecurity: Not on file  Transportation Needs: Not on file  Physical Activity: Not on file  Stress: Not on file  Social Connections: Not on file   Intimate Partner Violence: Not on file     Allergies  Allergen Reactions   Penicillins Anaphylaxis   Codeine      CBC    Component Value Date/Time   WBC 11.8 (H) 10/05/2021 0536   RBC 4.94 10/05/2021 0536   HGB 14.4 10/05/2021 0536   HCT 42.5 10/05/2021 0536   PLT 140 (L) 10/05/2021 0536   MCV 86.0 10/05/2021 0536   MCH 29.1 10/05/2021 0536   MCHC 33.9 10/05/2021 0536   RDW 13.9 10/05/2021 0536   LYMPHSABS 1.9 10/04/2021 0547   MONOABS 0.6 10/04/2021 0547   EOSABS 1.3 (H) 10/04/2021 0547   BASOSABS 0.1 10/04/2021 0547    Pulmonary Functions Testing Results:     No data to display          Outpatient Medications Prior to Visit  Medication Sig Dispense Refill   albuterol (PROVENTIL) (2.5 MG/3ML) 0.083% nebulizer solution Inhale 3 mLs (2.5 mg total) by nebulization once every 4 (four) hours as needed for wheezing or shortness of breath. 90 mL 1   aspirin 81 MG chewable tablet Chew 81 mg by mouth daily.     guaiFENesin (MUCINEX) 600 MG 12 hr tablet Take 1 tablet (600 mg total) by mouth 2 (two) times daily. 60 tablet 3   nicotine polacrilex (COMMIT) 4 MG lozenge Place inside cheek.     rosuvastatin (CRESTOR) 10 MG tablet Take 10 mg by mouth daily.     spironolactone (ALDACTONE) 25 MG tablet Take 12.5 mg by mouth daily.     albuterol (PROVENTIL HFA) 108 (90 Base) MCG/ACT inhaler Inhale 2 puffs into the lungs once every 6 (six) hours as needed for wheezing. 6.7 g 1   No facility-administered medications prior to visit.

## 2022-05-19 NOTE — Patient Instructions (Signed)
Today, I ordered blood work. You can get them draw at your preferred [facility] draw station. The nearest one to [facility] is at nearby [facility] ([address], [place], [place] [address]). 

## 2022-05-24 ENCOUNTER — Ambulatory Visit: Payer: Medicare Other | Admitting: Urology

## 2022-05-24 ENCOUNTER — Ambulatory Visit: Payer: Medicare Other | Admitting: *Deleted

## 2022-05-24 ENCOUNTER — Encounter: Payer: Self-pay | Admitting: Urology

## 2022-06-13 NOTE — Progress Notes (Unsigned)
I,[name],acting as a scribe for [employer], MD.,have documented all relevant documentation on the behalf of Ronnald Ramp, MD,as directed by  Ronnald Ramp, MD while in the presence of Ronnald Ramp, MD.   Established patient visit   Patient: Guy Bowen   DOB: 1956-03-23   66 y.o. Male  MRN: [id number] Visit Date: 06/14/2022  Today's healthcare provider: Ronnald Ramp, MD   Chief Complaint  Patient presents with   follow-up COPD   Subjective    HPI  COPD, Follow up  He was last seen for this 2 months ago. Patient reports that he is only using the Albuterol inhaler and need meds for the nebulizer. Reports that the other inhalers are really expensive, each prescription can cost over $400 per month Patient adds that he recently lost his third nephew and has been slowly working through the grieving process with the family He states that otherwise he feels well in regards to his breathing today He states that cold weather irritates the breathing and makes him feel more short of breath He reports that deep breathing during physical exams for example caused him to cough more He reports that he uses his albuterol inhaler 1-2 times daily and sometimes 3-4 times daily depending on how much he is out over the house doing errands in the cold air    He was evaluated by pulmonology (11/17) and updates include : -Pulmonary Function Test  - CBC with Differential/Platelet - Allergen Panel (27) + IGE - desloratadine (CLARINEX) 5 MG tablet; Take 1 tablet (5 mg total) by mouth daily.  - umeclidinium-vilanterol (ANORO ELLIPTA) 62.5-25 MCG/ACT AEPB; Inhale 1 puff into the lungs daily.   - fluticasone (Flovent) 220 MCG/ACT inhaler; Inhale 2 puffs into the lungs 2 (two) times daily.   - albuterol  F/u is scheduled for  Feb 2024 with Dr. Aundria Rud   Pulmonary Functions Testing Results:  No results found for: "FEV1", "FVC",  "FEV1FVC", "TLC"  -----------------------------------------------------------------------------------------   Medications: Outpatient Medications Prior to Visit  Medication Sig   aspirin 81 MG chewable tablet Chew 81 mg by mouth daily.   desloratadine (CLARINEX) 5 MG tablet Take 1 tablet (5 mg total) by mouth daily.   guaiFENesin (MUCINEX) 600 MG 12 hr tablet Take 1 tablet (600 mg total) by mouth 2 (two) times daily.   nicotine polacrilex (COMMIT) 4 MG lozenge Place inside cheek.   rosuvastatin (CRESTOR) 10 MG tablet Take 10 mg by mouth daily.   spironolactone (ALDACTONE) 25 MG tablet Take 12.5 mg by mouth daily.   [DISCONTINUED] albuterol (PROVENTIL HFA) 108 (90 Base) MCG/ACT inhaler Inhale 2 puffs into the lungs once every 6 (six) hours as needed for wheezing.   fluticasone (FLOVENT HFA) 220 MCG/ACT inhaler Inhale 2 puffs into the lungs 2 (two) times daily. (Patient not taking: Reported on 06/14/2022)   umeclidinium-vilanterol (ANORO ELLIPTA) 62.5-25 MCG/ACT AEPB Inhale 1 puff into the lungs daily. (Patient not taking: Reported on 06/14/2022)   [DISCONTINUED] albuterol (PROVENTIL) (2.5 MG/3ML) 0.083% nebulizer solution Inhale 3 mLs (2.5 mg total) by nebulization once every 4 (four) hours as needed for wheezing or shortness of breath. (Patient not taking: Reported on 06/14/2022)   No facility-administered medications prior to visit.    Review of Systems     Objective    BP 129/75 (BP Location: Left Arm, Patient Position: Sitting, Cuff Size: Normal)   Pulse 86   Temp 97.8 F (36.6 C) (Oral)   Resp 16   Wt  140 lb 9.6 oz (63.8 kg)   SpO2 97%   BMI 19.61 kg/m    Physical Exam Constitutional:      General: He is not in acute distress.    Appearance: He is not ill-appearing, toxic-appearing or diaphoretic.  Cardiovascular:     Rate and Rhythm: Normal rate and regular rhythm.  Pulmonary:     Effort: Pulmonary effort is normal. Prolonged expiration present. No tachypnea,  accessory muscle usage or respiratory distress.     Breath sounds: No stridor. Examination of the right-middle field reveals decreased breath sounds. Examination of the left-middle field reveals decreased breath sounds. Examination of the right-lower field reveals decreased breath sounds. Examination of the left-lower field reveals decreased breath sounds. Decreased breath sounds present. No wheezing, rhonchi or rales.  Musculoskeletal:     Right lower leg: No edema.     Left lower leg: No edema.  Skin:    Coloration: Skin is not pale.     Nails: There is clubbing.  Neurological:     Mental Status: He is alert.       No results found for any visits on 06/14/22.  Assessment & Plan     Problem List Items Addressed This Visit       Respiratory   COPD (chronic obstructive pulmonary disease) (HCC) - Primary    Chronic, stable Unable to adhere to triple therapy recommended by pulmonology We will submit referral to chronic care management for medication assistance Patient requested refill of albuterol nebulizer Also requested refills for albuterol inhaler Refills provided for both Patient will follow-up in 2 months      Relevant Medications   albuterol (PROVENTIL) (2.5 MG/3ML) 0.083% nebulizer solution   albuterol (PROVENTIL HFA) 108 (90 Base) MCG/ACT inhaler   Other Relevant Orders   AMB Referral to Chronic Care Management Services   Other Visit Diagnoses     Shortness of breath       Relevant Medications   albuterol (PROVENTIL HFA) 108 (90 Base) MCG/ACT inhaler        Return in about 2 months (around 08/15/2022) for COPD f/u .      I, Ronnald Ramp, MD, have reviewed all documentation for this visit.  Portions of this information were initially documented by the CMA and reviewed by me for thoroughness and accuracy.      Ronnald Ramp, MD  [facility] [phone] (phone) [phone] (fax)  [facility]

## 2022-06-14 ENCOUNTER — Ambulatory Visit (INDEPENDENT_AMBULATORY_CARE_PROVIDER_SITE_OTHER): Payer: Medicare Other | Admitting: Family Medicine

## 2022-06-14 ENCOUNTER — Encounter: Payer: Self-pay | Admitting: Family Medicine

## 2022-06-14 VITALS — BP 129/75 | HR 86 | Temp 97.8°F | Resp 16 | Wt 140.6 lb

## 2022-06-14 DIAGNOSIS — J449 Chronic obstructive pulmonary disease, unspecified: Secondary | ICD-10-CM | POA: Diagnosis not present

## 2022-06-14 DIAGNOSIS — R0602 Shortness of breath: Secondary | ICD-10-CM | POA: Diagnosis not present

## 2022-06-14 MED ORDER — ALBUTEROL SULFATE (2.5 MG/3ML) 0.083% IN NEBU: 2.5000 mg | INHALATION_SOLUTION | RESPIRATORY_TRACT | 6 refills | Status: DC | PRN

## 2022-06-14 MED ORDER — ALBUTEROL SULFATE HFA 108 (90 BASE) MCG/ACT IN AERS: 2.0000 | INHALATION_SPRAY | Freq: Four times a day (QID) | RESPIRATORY_TRACT | 6 refills | Status: DC | PRN

## 2022-06-14 NOTE — Assessment & Plan Note (Signed)
Chronic, stable Unable to adhere to triple therapy recommended by pulmonology We will submit referral to chronic care management for medication assistance Patient requested refill of albuterol nebulizer Also requested refills for albuterol inhaler Refills provided for both Patient will follow-up in 2 months

## 2022-06-14 NOTE — Patient Instructions (Signed)
I have placed a referral for our chronic care management team to help with medications/your inhalers  Please be on the lookout for a call from them    In the meantime, I have inhaler both the albuterol inhaler and the albuterol for the nebulizer machine.    Please follow up in 2 months to check on your breathing    Dr. Roxan Hockey

## 2022-06-18 ENCOUNTER — Other Ambulatory Visit: Payer: Self-pay

## 2022-06-18 ENCOUNTER — Emergency Department
Admission: EM | Admit: 2022-06-18 | Discharge: 2022-06-18 | Disposition: A | Discharge status: Home / Self Care | Payer: Medicare Other | Attending: Emergency Medicine | Admitting: Emergency Medicine

## 2022-06-18 ENCOUNTER — Emergency Department: Payer: Medicare Other

## 2022-06-18 DIAGNOSIS — Z20822 Contact with and (suspected) exposure to covid-19: Secondary | ICD-10-CM | POA: Insufficient documentation

## 2022-06-18 DIAGNOSIS — E871 Hypo-osmolality and hyponatremia: Secondary | ICD-10-CM | POA: Insufficient documentation

## 2022-06-18 DIAGNOSIS — F17200 Nicotine dependence, unspecified, uncomplicated: Secondary | ICD-10-CM | POA: Insufficient documentation

## 2022-06-18 DIAGNOSIS — J449 Chronic obstructive pulmonary disease, unspecified: Secondary | ICD-10-CM | POA: Insufficient documentation

## 2022-06-18 DIAGNOSIS — J1100 Influenza due to unidentified influenza virus with unspecified type of pneumonia: Secondary | ICD-10-CM

## 2022-06-18 DIAGNOSIS — J09X1 Influenza due to identified novel influenza A virus with pneumonia: Secondary | ICD-10-CM | POA: Insufficient documentation

## 2022-06-18 DIAGNOSIS — J101 Influenza due to other identified influenza virus with other respiratory manifestations: Secondary | ICD-10-CM

## 2022-06-18 DIAGNOSIS — Z951 Presence of aortocoronary bypass graft: Secondary | ICD-10-CM | POA: Diagnosis not present

## 2022-06-18 DIAGNOSIS — I2510 Atherosclerotic heart disease of native coronary artery without angina pectoris: Secondary | ICD-10-CM | POA: Diagnosis not present

## 2022-06-18 DIAGNOSIS — R0602 Shortness of breath: Secondary | ICD-10-CM | POA: Diagnosis present

## 2022-06-18 LAB — CBC
HCT: 42.5 % (ref 39.0–52.0)
Hemoglobin: 13.9 g/dL (ref 13.0–17.0)
MCH: 28.8 pg (ref 26.0–34.0)
MCHC: 32.7 g/dL (ref 30.0–36.0)
MCV: 88.2 fL (ref 80.0–100.0)
Platelets: UNDETERMINED 10*3/uL (ref 150–400)
RBC: 4.82 MIL/uL (ref 4.22–5.81)
RDW: 13.8 % (ref 11.5–15.5)
WBC: 5.9 10*3/uL (ref 4.0–10.5)
nRBC: 0 % (ref 0.0–0.2)

## 2022-06-18 LAB — COMPREHENSIVE METABOLIC PANEL
ALT: 25 U/L (ref 0–44)
AST: 27 U/L (ref 15–41)
Albumin: 3.7 g/dL (ref 3.5–5.0)
Alkaline Phosphatase: 36 U/L — ABNORMAL LOW (ref 38–126)
Anion gap: 8 (ref 5–15)
BUN: 8 mg/dL (ref 8–23)
CO2: 24 mmol/L (ref 22–32)
Calcium: 8.4 mg/dL — ABNORMAL LOW (ref 8.9–10.3)
Chloride: 94 mmol/L — ABNORMAL LOW (ref 98–111)
Creatinine, Ser: 0.89 mg/dL (ref 0.61–1.24)
GFR, Estimated: >60 mL/min (ref >60)
Glucose, Bld: 110 mg/dL — ABNORMAL HIGH (ref 70–99)
Potassium: 3.7 mmol/L (ref 3.5–5.1)
Sodium: 126 mmol/L — ABNORMAL LOW (ref 135–145)
Total Bilirubin: 0.5 mg/dL (ref 0.3–1.2)
Total Protein: 7.2 g/dL (ref 6.5–8.1)

## 2022-06-18 LAB — RESP PANEL BY RT-PCR (RSV, FLU A&B, COVID)  RVPGX2
Influenza A by PCR: POSITIVE — AB
Influenza B by PCR: NEGATIVE
Resp Syncytial Virus by PCR: NEGATIVE
SARS Coronavirus 2 by RT PCR: NEGATIVE

## 2022-06-18 MED ORDER — BENZONATATE 200 MG PO CAPS: 200.0000 mg | ORAL_CAPSULE | Freq: Three times a day (TID) | ORAL | 0 refills | Status: DC | PRN

## 2022-06-18 MED ORDER — SODIUM CHLORIDE 0.9 % IV BOLUS
1000.0000 mL | Freq: Once | INTRAVENOUS | Status: AC
  Administered 2022-06-18: 1000 mL via INTRAVENOUS

## 2022-06-18 MED ORDER — LEVOFLOXACIN 750 MG PO TABS: 750.0000 mg | ORAL_TABLET | Freq: Every day | ORAL | 0 refills | Status: AC

## 2022-06-18 NOTE — ED Triage Notes (Signed)
Cough, subjective fever, body aches, chills, SOB since Thursday. Hx of COPD. Does not wear oxygen at home. Reports sputum production with cough that is yellow-light brown. Pt alert and oriented. Shallow RR noted. Pt speaking in full sentences with symmetric chest rise and fall.

## 2022-06-18 NOTE — ED Provider Notes (Signed)
[facility] Provider Note    Event Date/Time   First MD Initiated Contact with Patient 06/18/22 0830     (approximate)   History   flu symptoms   HPI  Guy Bowen is a 66 y.o. male   presents to the ED with complaint of fever, body aches, chills, shortness of breath with a productive cough.  Patient has a history of COPD, nicotine dependence, non-STEMI, CAD and continues to smoke occasionally.  Patient has been using his inhaler infrequently and only uses his nebulizer machine once a day.        Physical Exam   Triage Vital Signs: ED Triage Vitals  Enc Vitals Group     BP 06/18/22 0657 (!) 150/83     Pulse Rate 06/18/22 0657 (!) 114     Resp 06/18/22 0657 (!) 22     Temp 06/18/22 0657 98.6 F (37 C)     Temp Source 06/18/22 0657 Oral     SpO2 06/18/22 0657 96 %     Weight 06/18/22 0658 140 lb (63.5 kg)     Height 06/18/22 0658 5\' 11"  (1.803 m)     Head Circumference --      Peak Flow --      Pain Score 06/18/22 0658 3     Pain Loc --      Pain Edu? --      Excl. in GC? --     Most recent vital signs: Vitals:   06/18/22 0657 06/18/22 1123  BP: (!) 150/83 133/73  Pulse: (!) 114 99  Resp: (!) 22 18  Temp: 98.6 F (37 C)   SpO2: 96% 93%     General: Awake, no distress.  Alert, answers questions appropriately and able to speak in complete sentences. CV:  Good peripheral perfusion.  Heart regular rate and rhythm. Resp:  Normal effort.  Lungs with faint bilateral expiratory wheeze. Abd:  No distention.  Soft, flat, nontender, bowel sounds present x 4 quadrants. Other:     ED Results / Procedures / Treatments   Labs (all labs ordered are listed, but only abnormal results are displayed) Labs Reviewed  RESP PANEL BY RT-PCR (RSV, FLU A&B, COVID)  RVPGX2 - Abnormal; Notable for the following components:      Result Value   Influenza A by PCR POSITIVE (*)    All other components within normal limits  COMPREHENSIVE METABOLIC PANEL  - Abnormal; Notable for the following components:   Sodium 126 (*)    Chloride 94 (*)    Glucose, Bld 110 (*)    Calcium 8.4 (*)    Alkaline Phosphatase 36 (*)    All other components within normal limits  CBC     EKG Vent. rate 118 BPM PR interval 144 ms QRS duration 80 ms QT/QTcB 330/462 ms P-R-T axes 86 -39 80 Sinus tachycardia with Premature atrial complexes Left axis deviation Pulmonary disease pattern Possible Inferior infarct (cited on or before 18-Jun-2022) Abnormal ECG    RADIOLOGY Chest x-ray one-view portable images were reviewed and interpreted as possible pneumonia.  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     PROCEDURES:  Critical Care performed:   Procedures   MEDICATIONS ORDERED IN ED: Medications  sodium chloride 0.9 % bolus 1,000 mL (0 mLs Intravenous Stopped 06/18/22 1043)     IMPRESSION / MDM / ASSESSMENT AND PLAN / ED COURSE  I reviewed the triage vital signs  and the nursing notes.   Differential diagnosis includes, but is not limited to, influenza, COVID, RSV, pneumonia, bronchitis, dehydration.  66 year old male presents to the ED with complaint of fever, chills, body aches, shortness of breath since Thursday.  Patient has a history of COPD and has been using his inhaler once a day and also using his nebulizer machine once a day.  Patient denies any vomiting or diarrhea.  Lab work included WBC 5.9 respiratory panel was positive for influenza and patient was made aware.  Also CMP showed sodium low at 126, chloride 94, nonfasting glucose 110.  Chest x-ray showed lower lobe pneumonia.  Patient was made aware that he could use his nebulizer treatments every 4 hours if needed for shortness of breath, coughing or wheezing.  He was made aware that he does have pneumonia and a prescription for Levaquin 750 mg was sent to the pharmacy for him to begin taking for 7 days.  Tessalon Perles was sent to the  pharmacy for cough as needed.  Patient is encouraged to drink fluids frequently and made aware that he is slightly dehydrated.  Wife is present in understands instructions.  Patient is to follow-up with his PCP or return to the emergency department if any severe worsening of his symptoms, shortness of breath or difficulty breathing.      Patient's presentation is most consistent with acute complicated illness / injury requiring diagnostic workup.  FINAL CLINICAL IMPRESSION(S) / ED DIAGNOSES   Final diagnoses:  Influenza A  Pneumonia of left lower lobe due to influenza A virus     Rx / DC Orders   ED Discharge Orders          Ordered    levofloxacin (LEVAQUIN) 750 MG tablet  Daily        06/18/22 1111    benzonatate (TESSALON) 200 MG capsule  3 times daily PRN        06/18/22 1111             Note:  This document was prepared using Dragon voice recognition software and may include unintentional dictation errors.   Tommi Rumps, PA-C 06/18/22 1514    Merwyn Katos, MD 06/18/22 [phone]

## 2022-06-18 NOTE — ED Notes (Signed)
Patient transported to X-ray 

## 2022-06-18 NOTE — ED Notes (Signed)
E signature pad not working 

## 2022-06-18 NOTE — Discharge Instructions (Addendum)
Call make a follow-up appoint with your primary care provider if any continued problems or concerns.  A prescription for Tessalon Perles was sent to the pharmacy to take every 8 hours if needed for cough.  A prescription for Levaquin which is a antibiotic that she will take once a day for the next 7 days was also sent to the pharmacy.  Continue to drink fluids to stay hydrated.  Also use the spirometer that was given to you while in the emergency department to continue taking deep breaths.  This is because you also have pneumonia which most likely is due to your flu but is being covered with an antibiotic since you do have COPD.  Return to the emergency department if any severe worsening of your symptoms such as difficulty breathing or shortness of breath.  I would also recommend that you use your inhaler more than 1 time a day.  Also be aware that she could use your nebulizer treatment every 4 hours if needed for shortness of breath, wheezing or coughing.

## 2022-07-12 ENCOUNTER — Telehealth: Payer: Self-pay | Admitting: Pharmacist

## 2022-07-12 NOTE — Progress Notes (Signed)
   Outreach Note  07/12/2022 Name: TORRENCE HAMMACK MRN: [id number] DOB: 1956/01/27  Referred by: Eulis Foster, MD Reason for referral : No chief complaint on file.   Was unable to reach patient via telephone today and have left HIPAA compliant voicemail asking patient to return my call.    Follow Up Plan: Will attempt to reach patient by telephone again within the next 14 days  Wallace Cullens, PharmD, [facility] [phone]

## 2022-07-26 ENCOUNTER — Telehealth: Payer: Self-pay | Admitting: Pharmacist

## 2022-07-26 NOTE — Telephone Encounter (Signed)
   Outreach Note  07/26/2022 Name: Guy Bowen MRN: [id number] DOB: 1956-03-08  Referred by: Eulis Foster, MD Reason for referral : No chief complaint on file.   Was unable to reach patient via telephone today and have left HIPAA compliant voicemail asking patient to return my call. Outreach attempt #2.   Follow Up Plan: Will attempt to reach patient by telephone again within the next 14 days  Wallace Cullens, PharmD, [facility] [phone]

## 2022-08-02 ENCOUNTER — Telehealth: Payer: Self-pay | Admitting: Pharmacist

## 2022-08-02 NOTE — Telephone Encounter (Signed)
   Outreach Note  08/02/2022 Name: Guy Bowen MRN: [id number] DOB: 1955-12-08  Referred by: Eulis Foster, MD Reason for referral : No chief complaint on file.   Was unable to reach patient via telephone today and have left HIPAA compliant voicemail asking patient to return my call. Outreach attempt #3.   Follow Up Plan: Will notify provider of unsuccessful attempts to reach patient. Note patient scheduled for appointment with PCP on 2/13. Will ask provider to have patient call Care Guide team if still interested in scheduling an appointment  Wallace Cullens, PharmD, [place] [phone]

## 2022-08-04 ENCOUNTER — Encounter: Payer: Self-pay | Admitting: *Deleted

## 2022-08-14 NOTE — Progress Notes (Unsigned)
I,[name],acting as a scribe for [employer], MD.,have documented all relevant documentation on the behalf of Eulis Foster, MD,as directed by  Eulis Foster, MD while in the presence of Eulis Foster, MD.   Established patient visit   Patient: Guy Bowen   DOB: September 29, 1955   67 y.o. Male  MRN: [id number] Visit Date: 08/15/2022  Today's healthcare provider: Eulis Foster, MD   Chief Complaint  Patient presents with   Follow-up   Subjective    HPI  COPD, Follow up  He was last seen for this 2 months ago. Changes made include none. Patient on Albuterol nebulizer solution and Albuterol inhaler   He reports excellent compliance with treatment. Uses the nebulizer every morning and uses that in the AM and the inhaler 3-4 times per day  He reports that he gets 3-4 hours of sleep that is interrupted by an hour due to coughing followed by 3-4 more hours of sleep and has to nap during the day due to feeling tired  He is not having side effects.  he uses rescue inhaler 3 per  day . He IS experiencing cough and sputum production is the same. He is NOT experiencing dyspnea or wheezing. he reports breathing is  stable .  Patient reviewed good Rx with lowest cost for 1 month supply would be $682  Reviewed drug listed with insurance and is listed as tier 3    The albuterol inhaler is $26/inhaler and the $50 for the albuterol solution using good Rx   Health Update Information obtained from EMR review from ED encounter on 06/18/22  Patient was diagnosed with influenza A and treated for PNA of the left lower lobe for 7 days  He was found to have low sodium of 126 (chronically low but range appears to e around low 130s), and was hypocalcemic with level of 8.4 as well as low alk phos of 36  Recommended repeat levels today     Chemistry      Component Value Date/Time   NA 130 (L) 08/15/2022 1117   K 4.6 08/15/2022 1117    CL 93 (L) 08/15/2022 1117   CO2 22 08/15/2022 1117   BUN 13 08/15/2022 1117   CREATININE 0.93 08/15/2022 1117      Component Value Date/Time   CALCIUM 9.5 08/15/2022 1117   ALKPHOS 49 08/15/2022 1117   AST 13 08/15/2022 1117   ALT 20 08/15/2022 1117   BILITOT <0.2 08/15/2022 1117        Pulmonary Functions Testing Results:  No results found for: "FEV1", "FVC", "FEV1FVC", "TLC"  -----------------------------------------------------------------------------------------  Protein Calorie Malnutirition  Weight today is 139 compared to 140 two months ago    Medications: Outpatient Medications Prior to Visit  Medication Sig   albuterol (PROVENTIL HFA) 108 (90 Base) MCG/ACT inhaler Inhale 2 puffs into the lungs once every 6 (six) hours as needed for wheezing.   albuterol (PROVENTIL) (2.5 MG/3ML) 0.083% nebulizer solution Inhale 3 mLs (2.5 mg total) by nebulization once every 4 (four) hours as needed for wheezing or shortness of breath.   aspirin 81 MG chewable tablet Chew 81 mg by mouth daily.   nicotine polacrilex (COMMIT) 4 MG lozenge Place inside cheek.   rosuvastatin (CRESTOR) 10 MG tablet Take 10 mg by mouth daily.   spironolactone (ALDACTONE) 25 MG tablet Take 12.5 mg by mouth daily.   benzonatate (TESSALON) 200 MG capsule Take 1 capsule (200 mg total) by mouth 3 (three) times daily  as needed for cough. (Patient not taking: Reported on 08/15/2022)   desloratadine (CLARINEX) 5 MG tablet Take 1 tablet (5 mg total) by mouth daily. (Patient not taking: Reported on 08/15/2022)   guaiFENesin (MUCINEX) 600 MG 12 hr tablet Take 1 tablet (600 mg total) by mouth 2 (two) times daily. (Patient not taking: Reported on 08/15/2022)   [DISCONTINUED] fluticasone (FLOVENT HFA) 220 MCG/ACT inhaler Inhale 2 puffs into the lungs 2 (two) times daily. (Patient not taking: Reported on 06/14/2022)   [DISCONTINUED] umeclidinium-vilanterol (ANORO ELLIPTA) 62.5-25 MCG/ACT AEPB Inhale 1 puff into the lungs  daily. (Patient not taking: Reported on 06/14/2022)   No facility-administered medications prior to visit.    Review of Systems     Objective    BP 127/80 (BP Location: Left Arm, Cuff Size: Normal)   Pulse (!) 102   Temp (!) 97.5 F (36.4 C)   Wt 139 lb 3.2 oz (63.1 kg)   SpO2 97%   BMI 19.41 kg/m    Physical Exam Vitals reviewed.  Constitutional:      General: He is not in acute distress.    Appearance: Normal appearance. He is not ill-appearing, toxic-appearing or diaphoretic.  Eyes:     Conjunctiva/sclera: Conjunctivae normal.  Cardiovascular:     Rate and Rhythm: Normal rate and regular rhythm.     Pulses: Normal pulses.     Heart sounds: Normal heart sounds. No murmur heard.    No friction rub. No gallop.  Pulmonary:     Effort: Pulmonary effort is normal. No respiratory distress.     Breath sounds: No stridor. Decreased breath sounds present. No wheezing, rhonchi or rales.  Abdominal:     General: Bowel sounds are normal. There is no distension.     Palpations: Abdomen is soft.     Tenderness: There is no abdominal tenderness.  Musculoskeletal:     Right lower leg: No edema.     Left lower leg: No edema.  Skin:    Findings: No erythema or rash.  Neurological:     Mental Status: He is alert and oriented to person, place, and time.     Results for orders placed or performed in visit on 08/15/22  Comprehensive metabolic panel  Result Value Ref Range   Glucose 95 70 - 99 mg/dL   BUN 13 8 - 27 mg/dL   Creatinine, Ser 0.93 0.76 - 1.27 mg/dL   eGFR 89 >59 mL/min/1.73   BUN/Creatinine Ratio 14 10 - 24   Sodium 130 (L) 134 - 144 mmol/L   Potassium 4.6 3.5 - 5.2 mmol/L   Chloride 93 (L) 96 - 106 mmol/L   CO2 22 20 - 29 mmol/L   Calcium 9.5 8.6 - 10.2 mg/dL   Total Protein 6.7 6.0 - 8.5 g/dL   Albumin 4.4 3.9 - 4.9 g/dL   Globulin, Total 2.3 1.5 - 4.5 g/dL   Albumin/Globulin Ratio 1.9 1.2 - 2.2   Bilirubin Total <0.2 0.0 - 1.2 mg/dL   Alkaline Phosphatase  49 44 - 121 IU/L   AST 13 0 - 40 IU/L   ALT 20 0 - 44 IU/L    Assessment & Plan     Problem List Items Addressed This Visit       Respiratory   COPD (chronic obstructive pulmonary disease) (HCC) - Primary    Chronic  Stable  Not controlled still with bothersome nighttime cough and using albuterol inhaler multiple times per day  Started patient on trelegy 100-62.5-25mg daily,  given samples for 2 months supply  Patient to follow upin 6 weeks  Patient was given contact information from pharmacist to help with cost associated with controller inhaler therapy, see AVS  Previously referred to chronic care management for cost assistance with inhalers, they were unable to contact patient so he was given phone number to contact them        Relevant Medications   Fluticasone-Umeclidin-Vilant (TRELEGY ELLIPTA) 100-62.5-25 MCG/ACT AEPB     Other   Protein-calorie malnutrition, severe    Chronic  Stable  Weight one pound down from last weight check in Dec 2023  Will check protein onCMP today        Relevant Orders   Comprehensive metabolic panel (Completed)   Hyponatremia    Chronic, stable  Will recheck Na level today on CMP  Was low in Dec upon chart review of ED encounter on 06/18/22   Chemistry      Component Value Date/Time   NA 126 (L) 06/18/2022 0705   K 3.7 06/18/2022 0705   CL 94 (L) 06/18/2022 0705   CO2 24 06/18/2022 0705   BUN 8 06/18/2022 0705   CREATININE 0.89 06/18/2022 0705      Component Value Date/Time   CALCIUM 8.4 (L) 06/18/2022 0705   ALKPHOS 36 (L) 06/18/2022 0705   AST 27 06/18/2022 0705   ALT 25 06/18/2022 0705   BILITOT 0.5 06/18/2022 0705           Relevant Orders   Comprehensive metabolic panel (Completed)   Other Visit Diagnoses     Hypocalcemia       Relevant Orders   Comprehensive metabolic panel (Completed)        Return in about 6 weeks (around 09/26/2022) for COPD f/u.        Eulis Foster, MD  [facility] [phone] (phone) [phone] (fax)  [place]

## 2022-08-15 ENCOUNTER — Ambulatory Visit (INDEPENDENT_AMBULATORY_CARE_PROVIDER_SITE_OTHER): Payer: Medicare Other | Admitting: Family Medicine

## 2022-08-15 ENCOUNTER — Encounter: Payer: Self-pay | Admitting: Family Medicine

## 2022-08-15 VITALS — BP 127/80 | HR 102 | Temp 97.5°F | Wt 139.2 lb

## 2022-08-15 DIAGNOSIS — E43 Unspecified severe protein-calorie malnutrition: Secondary | ICD-10-CM | POA: Diagnosis not present

## 2022-08-15 DIAGNOSIS — J438 Other emphysema: Secondary | ICD-10-CM | POA: Diagnosis not present

## 2022-08-15 DIAGNOSIS — E871 Hypo-osmolality and hyponatremia: Secondary | ICD-10-CM | POA: Diagnosis not present

## 2022-08-15 MED ORDER — TRELEGY ELLIPTA 100-62.5-25 MCG/ACT IN AEPB: 1.0000 | INHALATION_SPRAY | Freq: Every day | RESPIRATORY_TRACT | 0 refills | Status: DC

## 2022-08-15 NOTE — Patient Instructions (Addendum)
Please contact Guy Bowen   She is a Care Guide who will help work with you to find an inhaler regimen to control COPD that is affordable   Her number is [phone]   I have provided you with some trelegy samples.  Please take 1 puff daily.

## 2022-08-15 NOTE — Assessment & Plan Note (Signed)
Chronic  Stable  Weight one pound down from last weight check in Dec 2023  Will check protein onCMP today

## 2022-08-15 NOTE — Assessment & Plan Note (Addendum)
Chronic, stable  Will recheck Na level today on CMP  Was low in Dec upon chart review of ED encounter on 06/18/22   Chemistry      Component Value Date/Time   NA 126 (L) 06/18/2022 0705   K 3.7 06/18/2022 0705   CL 94 (L) 06/18/2022 0705   CO2 24 06/18/2022 0705   BUN 8 06/18/2022 0705   CREATININE 0.89 06/18/2022 0705      Component Value Date/Time   CALCIUM 8.4 (L) 06/18/2022 0705   ALKPHOS 36 (L) 06/18/2022 0705   AST 27 06/18/2022 0705   ALT 25 06/18/2022 0705   BILITOT 0.5 06/18/2022 [id number]

## 2022-08-15 NOTE — Assessment & Plan Note (Signed)
Chronic  Stable  Not controlled still with bothersome nighttime cough and using albuterol inhaler multiple times per day  Started patient on trelegy 100-62.5-25mg daily, given samples for 2 months supply  Patient to follow upin 6 weeks  Patient was given contact information from pharmacist to help with cost associated with controller inhaler therapy, see AVS  Previously referred to chronic care management for cost assistance with inhalers, they were unable to contact patient so he was given phone number to contact them

## 2022-08-16 LAB — COMPREHENSIVE METABOLIC PANEL
ALT: 20 IU/L (ref 0–44)
AST: 13 IU/L (ref 0–40)
Albumin/Globulin Ratio: 1.9 (ref 1.2–2.2)
Albumin: 4.4 g/dL (ref 3.9–4.9)
Alkaline Phosphatase: 49 IU/L (ref 44–121)
BUN/Creatinine Ratio: 14 (ref 10–24)
BUN: 13 mg/dL (ref 8–27)
Bilirubin Total: <0.2 mg/dL (ref 0.0–1.2)
CO2: 22 mmol/L (ref 20–29)
Calcium: 9.5 mg/dL (ref 8.6–10.2)
Chloride: 93 mmol/L — ABNORMAL LOW (ref 96–106)
Creatinine, Ser: 0.93 mg/dL (ref 0.76–1.27)
Globulin, Total: 2.3 g/dL (ref 1.5–4.5)
Glucose: 95 mg/dL (ref 70–99)
Potassium: 4.6 mmol/L (ref 3.5–5.2)
Sodium: 130 mmol/L — ABNORMAL LOW (ref 134–144)
Total Protein: 6.7 g/dL (ref 6.0–8.5)
eGFR: 89 mL/min/{1.73_m2} (ref >59)

## 2022-08-25 ENCOUNTER — Other Ambulatory Visit: Payer: Self-pay

## 2022-08-25 MED ORDER — ROSUVASTATIN CALCIUM 10 MG PO TABS: 10.0000 mg | ORAL_TABLET | Freq: Every day | ORAL | 0 refills | Status: DC

## 2022-09-05 ENCOUNTER — Other Ambulatory Visit: Payer: Self-pay

## 2022-09-05 DIAGNOSIS — I255 Ischemic cardiomyopathy: Secondary | ICD-10-CM

## 2022-09-05 MED ORDER — SPIRONOLACTONE 25 MG PO TABS: 12.5000 mg | ORAL_TABLET | Freq: Every day | ORAL | 0 refills | Status: DC

## 2022-09-07 ENCOUNTER — Other Ambulatory Visit: Payer: Self-pay

## 2022-09-07 DIAGNOSIS — I255 Ischemic cardiomyopathy: Secondary | ICD-10-CM

## 2022-09-07 MED ORDER — SPIRONOLACTONE 25 MG PO TABS: 12.5000 mg | ORAL_TABLET | Freq: Every day | ORAL | 0 refills | Status: DC

## 2022-09-07 MED ORDER — ROSUVASTATIN CALCIUM 10 MG PO TABS: 10.0000 mg | ORAL_TABLET | Freq: Every day | ORAL | 0 refills | Status: DC

## 2022-09-26 ENCOUNTER — Other Ambulatory Visit: Payer: Self-pay

## 2022-09-26 ENCOUNTER — Ambulatory Visit (INDEPENDENT_AMBULATORY_CARE_PROVIDER_SITE_OTHER): Payer: Medicare Other | Admitting: Family Medicine

## 2022-09-26 ENCOUNTER — Encounter: Payer: Self-pay | Admitting: Family Medicine

## 2022-09-26 VITALS — BP 158/81 | HR 86 | Resp 16 | Wt 138.8 lb

## 2022-09-26 DIAGNOSIS — R0602 Shortness of breath: Secondary | ICD-10-CM

## 2022-09-26 DIAGNOSIS — Z87891 Personal history of nicotine dependence: Secondary | ICD-10-CM | POA: Insufficient documentation

## 2022-09-26 DIAGNOSIS — R29898 Other symptoms and signs involving the musculoskeletal system: Secondary | ICD-10-CM | POA: Insufficient documentation

## 2022-09-26 DIAGNOSIS — J438 Other emphysema: Secondary | ICD-10-CM

## 2022-09-26 DIAGNOSIS — R030 Elevated blood-pressure reading, without diagnosis of hypertension: Secondary | ICD-10-CM | POA: Diagnosis not present

## 2022-09-26 MED ORDER — ALBUTEROL SULFATE HFA 108 (90 BASE) MCG/ACT IN AERS
2.0000 | INHALATION_SPRAY | Freq: Four times a day (QID) | RESPIRATORY_TRACT | 6 refills | Status: DC | PRN
  Filled 2022-09-26: qty 6.7, 25d supply, fill #0

## 2022-09-26 MED ORDER — ALBUTEROL SULFATE (2.5 MG/3ML) 0.083% IN NEBU: 2.5000 mg | INHALATION_SOLUTION | RESPIRATORY_TRACT | 6 refills | Status: DC | PRN

## 2022-09-26 MED ORDER — ALBUTEROL SULFATE (2.5 MG/3ML) 0.083% IN NEBU
2.5000 mg | INHALATION_SOLUTION | RESPIRATORY_TRACT | 6 refills | Status: DC | PRN
  Filled 2022-09-26: qty 75, 5d supply, fill #0

## 2022-09-26 MED ORDER — ALBUTEROL SULFATE HFA 108 (90 BASE) MCG/ACT IN AERS: 2.0000 | INHALATION_SPRAY | Freq: Four times a day (QID) | RESPIRATORY_TRACT | 6 refills | Status: DC | PRN

## 2022-09-26 NOTE — Patient Instructions (Addendum)
It was a pleasure to see you today!  Thank you for choosing [facility] for your primary care.   Guy Bowen was seen for COPD.   Our plans for today were: You can start back on your albuterol treatments for your symptoms and stop the Trelegy.  I recommend following up with your pulmonologist to discuss additional treatment options for lung condition.  I have submitted a lung cancer screening chest CT, please be on the lookout for a call to schedule this appt Please continue your efforts to cut back on smoking cigarettes.  For the weakness in your legs, please consider physical therapy AND scheduling an appointment with your cardiologist in the next few weeks.  To keep you healthy, please keep in mind the following health maintenance items that you are due for:   Annual Wellness Visit   Shingles vaccine  Lung Cancer screening    You should return to our clinic in 4 months for your annual wellness visit   Best Wishes,   Dr. Quentin Cornwall

## 2022-09-26 NOTE — Assessment & Plan Note (Signed)
Patient is chronic, current smoker  Reports smoking 2 cigarettes per week now, used to smoke 2 PPD  Referral for lung cancer screening chest CT submitted today  Emphasized importance of smoking cessation for better lung health  Patient states he will continue to work on cessation

## 2022-09-26 NOTE — Assessment & Plan Note (Signed)
BP elevated in office today  Reviewed prior visits and most of BP were within normal ranges  Recommended patient monitor at home and will recheck at follow up visit  No medications changes recommended today

## 2022-09-26 NOTE — Progress Notes (Signed)
Established patient visit   Patient: Guy Bowen   DOB: 1955/07/21   67 y.o. Male  MRN: [id number] Visit Date: 09/26/2022  Today's healthcare provider: Eulis Foster, MD   Chief Complaint  Patient presents with   Follow-up   Subjective    HPI  Patient comes in for 6 weeks follow-up COPD. Management since last visit include Start Trelegy 100-62.5-25 mg daily. Patient was given samples for 2 months.  Patient reports Trelegy is not helping. He reports not seeing any difference. Still getting out of breath with movement and without movement.    Had all teeth removed Expecting dentures after appt in 2 weeks    Medications: Outpatient Medications Prior to Visit  Medication Sig   aspirin 81 MG chewable tablet Chew 81 mg by mouth daily.   Fluticasone-Umeclidin-Vilant (TRELEGY ELLIPTA) 100-62.5-25 MCG/ACT AEPB Inhale 1 puff into the lungs daily.   nicotine polacrilex (COMMIT) 4 MG lozenge Place inside cheek.   rosuvastatin (CRESTOR) 10 MG tablet Take 1 tablet (10 mg total) by mouth daily.   spironolactone (ALDACTONE) 25 MG tablet Take 0.5 tablets (12.5 mg total) by mouth daily.   [DISCONTINUED] albuterol (PROVENTIL) (2.5 MG/3ML) 0.083% nebulizer solution Inhale 3 mLs (2.5 mg total) by nebulization once every 4 (four) hours as needed for wheezing or shortness of breath.   [DISCONTINUED] albuterol (PROVENTIL HFA) 108 (90 Base) MCG/ACT inhaler Inhale 2 puffs into the lungs once every 6 (six) hours as needed for wheezing. (Patient not taking: Reported on 09/26/2022)   [DISCONTINUED] benzonatate (TESSALON) 200 MG capsule Take 1 capsule (200 mg total) by mouth 3 (three) times daily as needed for cough.   [DISCONTINUED] desloratadine (CLARINEX) 5 MG tablet Take 1 tablet (5 mg total) by mouth daily.   [DISCONTINUED] guaiFENesin (MUCINEX) 600 MG 12 hr tablet Take 1 tablet (600 mg total) by mouth 2 (two) times daily. (Patient not taking: Reported on 08/15/2022)   No  facility-administered medications prior to visit.    Review of Systems     Objective    BP (!) 158/81 (BP Location: Left Arm, Patient Position: Sitting, Cuff Size: Normal)   Pulse 86   Resp 16   Wt 138 lb 12.8 oz (63 kg)   SpO2 99%   BMI 19.36 kg/m    Physical Exam Vitals reviewed.  Constitutional:      General: He is awake. He is not in acute distress.    Appearance: Normal appearance. He is underweight. He is not ill-appearing, toxic-appearing or diaphoretic.  Eyes:     Conjunctiva/sclera: Conjunctivae normal.  Neck:     Thyroid: No thyroid mass, thyromegaly or thyroid tenderness.     Vascular: No carotid bruit.  Cardiovascular:     Rate and Rhythm: Normal rate and regular rhythm.     Pulses: Normal pulses.     Heart sounds: Normal heart sounds. No murmur heard.    No friction rub. No gallop.  Pulmonary:     Effort: Pulmonary effort is normal. No tachypnea, accessory muscle usage or respiratory distress.     Breath sounds: No stridor. Decreased breath sounds present. No wheezing, rhonchi or rales.  Abdominal:     General: Bowel sounds are normal. There is no distension.     Palpations: Abdomen is soft.     Tenderness: There is no abdominal tenderness.  Musculoskeletal:     Right lower leg: No edema.     Left lower leg: No edema.  Lymphadenopathy:  Cervical: No cervical adenopathy.  Skin:    Findings: No erythema or rash.     Nails: There is clubbing.  Neurological:     Mental Status: He is alert and oriented to person, place, and time.  Psychiatric:        Behavior: Behavior is cooperative.      No results found for any visits on 09/26/22.  Assessment & Plan     Problem List Items Addressed This Visit       Respiratory   COPD (chronic obstructive pulmonary disease) ([place]) - Primary    Chronic, uncontrolled still has SOB throughout the day and feels tired during short walks without using his albuterol nebulizer treatments  No improvement on Trelegy   Patient prefers to resume albuterol inhaler every 6 hours and nebulizer in the mornings  We discussed trying Breztri but patient is hesistant given high cost to continue the prescription, would prefer to not start samples at this time  Recommended follow up with pulmonology as there is some concern for potential pulmonary htn, patient would like to focus on cardiology follow up first Refills provided for albuterol        Relevant Medications   albuterol (PROVENTIL HFA) 108 (90 Base) MCG/ACT inhaler   albuterol (PROVENTIL) (2.5 MG/3ML) 0.083% nebulizer solution   Other Relevant Orders   Ambulatory Referral Lung Cancer Screening [facility]     Nervous and Auditory   Transient leg weakness    Progressively becoming more symptomatic  Recommended PT as patient denies these symptoms are associated with chest tightness or SOB  Symptoms are relieved with rest, he denies presence of pain  Patient declines PT         Other   History of smoking greater than 50 pack years    Patient is chronic, current smoker  Reports smoking 2 cigarettes per week now, used to smoke 2 PPD  Referral for lung cancer screening chest CT submitted today  Emphasized importance of smoking cessation for better lung health  Patient states he will continue to work on cessation        Relevant Orders   Ambulatory Referral Lung Cancer Screening [place] [facility]   Elevated blood pressure reading    BP elevated in office today  Reviewed prior visits and most of BP were within normal ranges  Recommended patient monitor at home and will recheck at follow up visit  No medications changes recommended today        Other Visit Diagnoses     Shortness of breath       Relevant Medications   albuterol (PROVENTIL HFA) 108 (90 Base) MCG/ACT inhaler        Return in about 4 months (around 01/26/2023) for AWV.      The entirety of the information documented in the History of Present Illness, Review of  Systems and Physical Exam were personally obtained by me. Portions of this information were initially documented by Lyndel Pleasure, CMA. I, Eulis Foster, MD have reviewed the documentation above for thoroughness and accuracy.   Eulis Foster, MD  [facility] [phone] (phone) [phone] (fax)  [place]

## 2022-09-26 NOTE — Assessment & Plan Note (Signed)
Progressively becoming more symptomatic  Recommended PT as patient denies these symptoms are associated with chest tightness or SOB  Symptoms are relieved with rest, he denies presence of pain  Patient declines PT

## 2022-09-26 NOTE — Assessment & Plan Note (Addendum)
Chronic, uncontrolled still has SOB throughout the day and feels tired during short walks without using his albuterol nebulizer treatments  No improvement on Trelegy  Patient prefers to resume albuterol inhaler every 6 hours and nebulizer in the mornings  We discussed trying Breztri but patient is hesistant given high cost to continue the prescription, would prefer to not start samples at this time  Recommended follow up with pulmonology as there is some concern for potential pulmonary htn, patient would like to focus on cardiology follow up first Refills provided for albuterol

## 2022-10-07 ENCOUNTER — Other Ambulatory Visit: Payer: Self-pay | Admitting: Cardiovascular Disease

## 2022-10-11 DIAGNOSIS — H5213 Myopia, bilateral: Secondary | ICD-10-CM | POA: Diagnosis not present

## 2023-01-04 ENCOUNTER — Other Ambulatory Visit: Payer: Self-pay | Admitting: Cardiovascular Disease

## 2023-01-29 ENCOUNTER — Ambulatory Visit: Payer: Medicare Other | Admitting: Family Medicine

## 2023-03-14 ENCOUNTER — Ambulatory Visit (INDEPENDENT_AMBULATORY_CARE_PROVIDER_SITE_OTHER): Payer: Medicare Other | Admitting: Family Medicine

## 2023-03-14 ENCOUNTER — Encounter: Payer: Self-pay | Admitting: Family Medicine

## 2023-03-14 VITALS — BP 127/77 | HR 101 | Ht 71.0 in | Wt 139.4 lb

## 2023-03-14 DIAGNOSIS — Z13220 Encounter for screening for lipoid disorders: Secondary | ICD-10-CM

## 2023-03-14 DIAGNOSIS — E43 Unspecified severe protein-calorie malnutrition: Secondary | ICD-10-CM | POA: Diagnosis not present

## 2023-03-14 DIAGNOSIS — I255 Ischemic cardiomyopathy: Secondary | ICD-10-CM

## 2023-03-14 DIAGNOSIS — Z131 Encounter for screening for diabetes mellitus: Secondary | ICD-10-CM | POA: Diagnosis not present

## 2023-03-14 DIAGNOSIS — J438 Other emphysema: Secondary | ICD-10-CM

## 2023-03-14 DIAGNOSIS — Z0000 Encounter for general adult medical examination without abnormal findings: Secondary | ICD-10-CM | POA: Diagnosis not present

## 2023-03-14 DIAGNOSIS — I2510 Atherosclerotic heart disease of native coronary artery without angina pectoris: Secondary | ICD-10-CM | POA: Diagnosis not present

## 2023-03-14 DIAGNOSIS — F17218 Nicotine dependence, cigarettes, with other nicotine-induced disorders: Secondary | ICD-10-CM | POA: Diagnosis not present

## 2023-03-14 MED ORDER — SPIRONOLACTONE 25 MG PO TABS: 12.5000 mg | ORAL_TABLET | Freq: Every day | ORAL | 3 refills | Status: DC

## 2023-03-14 MED ORDER — ENALAPRIL MALEATE 2.5 MG PO TABS: 2.5000 mg | ORAL_TABLET | Freq: Every day | ORAL | 3 refills | Status: DC

## 2023-03-14 MED ORDER — ROSUVASTATIN CALCIUM 10 MG PO TABS: 10.0000 mg | ORAL_TABLET | Freq: Every day | ORAL | 3 refills | Status: DC

## 2023-03-14 NOTE — Progress Notes (Signed)
Annual Wellness Visit     Patient: Guy Bowen, Male    DOB: [birth year], 67 y.o.   MRN: [id number] Visit Date: 03/14/2023  Today's Provider: Ronnald Ramp, MD   Chief Complaint  Patient presents with   Annual Exam    Would like refills on Enalapril, Rouvastatin, and Spironolactone. Would also like to discuss Trilogy    Subjective    Guy Bowen is a 67 y.o. male who presents today for his Annual Wellness Visit.  He reports consuming a general and has to have soft diet because he does not have teeth right now   He does the elliptical daily and rides an exercise bike most days per week.   He generally feels fairly well.  He reports sleeping fairly well.    He does not have additional problems to discuss today.   Discussed the use of AI scribe software for clinical note transcription with the patient, who gave verbal consent to proceed.  History of Present Illness   Guy Bowen, a patient with a history of hypertension, hyperlipidemia, and chronic obstructive pulmonary disease (COPD), presents for an annual wellness check and medication refills. The patient reports a significant improvement in respiratory symptoms after quitting smoking four weeks ago and starting Trelegy, an inhaler medication for COPD. Previously, while still smoking, the patient did not notice any significant benefit from the medication. However, after cessation of smoking and re-initiation of Trelegy, the patient reports a marked improvement in breathing by the third day of use.  The patient also reports a recent issue with hand tremors, particularly when performing repetitive tasks. This has been affecting his ability to work with wood and models, activities he previously enjoyed. The tremors are described as involuntary and occur most of the time.  In terms of physical activity, the patient has been engaging in daily exercise, including cycling and using an elliptical machine. Prior to the  improvement in respiratory symptoms with Trelegy, the patient reports he would become easily breathless, particularly when cycling. However, he now reports being able to exercise more comfortably. Despite this, the patient notes he still tires easily, particularly after activities such as grocery shopping.  Regarding diet, the patient reports eating soft foods due to ongoing dental issues. He has had three sets of dentures, all of which have caused mouth soreness after one or two days of use. The patient is considering seeking legal advice due to the ongoing issues with the dentures.  The patient denies any recent falls, hearing issues (apart from tinnitus), and symptoms of depression. He has not consumed alcohol in over two years. The patient has received two doses of the COVID-19 vaccine and has not contracted the virus. He has not received the flu vaccine or the shingles vaccine.      Medications: Outpatient Medications Prior to Visit  Medication Sig   albuterol (PROVENTIL HFA) 108 (90 Base) MCG/ACT inhaler Inhale 2 puffs into the lungs once every 6 (six) hours as needed for wheezing.   albuterol (PROVENTIL) (2.5 MG/3ML) 0.083% nebulizer solution Inhale 3 mLs (2.5 mg total) by nebulization once every 4 (four) hours as needed for wheezing or shortness of breath.   aspirin 81 MG chewable tablet Chew 81 mg by mouth daily.   Fluticasone-Umeclidin-Vilant (TRELEGY ELLIPTA) 100-62.5-25 MCG/ACT AEPB Inhale 1 puff into the lungs daily.   nicotine polacrilex (COMMIT) 4 MG lozenge Place inside cheek.   [DISCONTINUED] enalapril (VASOTEC) 2.5 MG tablet TAKE 1 TABLET BY MOUTH DAILY   [  DISCONTINUED] rosuvastatin (CRESTOR) 10 MG tablet Take 1 tablet (10 mg total) by mouth daily.   [DISCONTINUED] spironolactone (ALDACTONE) 25 MG tablet Take 0.5 tablets (12.5 mg total) by mouth daily.   No facility-administered medications prior to visit.    Allergies  Allergen Reactions   Penicillins Anaphylaxis   Codeine      Patient Care Team: Ronnald Ramp, MD as PCP - General (Family Medicine)  Review of Systems       Objective    Vitals: BP 127/77 (BP Location: Left Arm, Patient Position: Sitting, Cuff Size: Normal)   Pulse (!) 101   Ht 5\' 11"  (1.803 m)   Wt 139 lb 6.4 oz (63.2 kg)   SpO2 98%   BMI 19.44 kg/m    Wt Readings from Last 3 Encounters:  03/14/23 139 lb 6.4 oz (63.2 kg)  09/26/22 138 lb 12.8 oz (63 kg)  08/15/22 139 lb 3.2 oz (63.1 kg)        Physical Exam Vitals reviewed.  Constitutional:      General: He is not in acute distress.    Appearance: Normal appearance. He is not ill-appearing, toxic-appearing or diaphoretic.  HENT:     Head: Normocephalic and atraumatic.     Right Ear: Tympanic membrane and external ear normal.     Left Ear: Tympanic membrane and external ear normal.     Nose: Nose normal.     Mouth/Throat:     Mouth: Mucous membranes are moist.     Pharynx: No oropharyngeal exudate or posterior oropharyngeal erythema.  Eyes:     General: No scleral icterus.    Extraocular Movements: Extraocular movements intact.     Conjunctiva/sclera: Conjunctivae normal.     Pupils: Pupils are equal, round, and reactive to light.  Neck:     Vascular: No carotid bruit.  Cardiovascular:     Rate and Rhythm: Normal rate and regular rhythm.     Pulses: Normal pulses.     Heart sounds: Normal heart sounds. No murmur heard.    No friction rub. No gallop.  Pulmonary:     Effort: Pulmonary effort is normal. No respiratory distress.     Breath sounds: No stridor. Decreased breath sounds present. No wheezing, rhonchi or rales.     Comments: Mildly decreased breath sounds diffusely  Abdominal:     General: Bowel sounds are normal. There is no distension.     Palpations: Abdomen is soft.     Tenderness: There is no abdominal tenderness.  Musculoskeletal:        General: No swelling, tenderness or signs of injury. Normal range of motion.     Cervical back:  Normal range of motion and neck supple. No rigidity or tenderness.     Right lower leg: No edema.     Left lower leg: No edema.  Lymphadenopathy:     Cervical: No cervical adenopathy.  Skin:    General: Skin is warm and dry.     Capillary Refill: Capillary refill takes less than 2 seconds.     Findings: No erythema or rash.  Neurological:     General: No focal deficit present.     Mental Status: He is alert and oriented to person, place, and time.     Cranial Nerves: Cranial nerves 2-12 are intact.     Sensory: Sensation is intact.     Motor: Motor function is intact. No weakness, tremor or abnormal muscle tone.     Gait: Gait normal.  Psychiatric:  Attention and Perception: Attention normal.        Mood and Affect: Mood normal.        Speech: Speech normal.        Behavior: Behavior normal. Behavior is cooperative.        Thought Content: Thought content normal.      Most recent functional status assessment:    03/14/2023    2:01 PM  In your present state of health, do you have any difficulty performing the following activities:  Hearing? 0  Vision? 0  Difficulty concentrating or making decisions? 0  Walking or climbing stairs? 1  Dressing or bathing? 0  Doing errands, shopping? 1   Most recent fall risk assessment:    03/14/2023    2:02 PM  Fall Risk   Falls in the past year? 0  Number falls in past yr: 0  Injury with Fall? 0    Most recent depression screenings:    03/14/2023    2:04 PM 08/15/2022   11:33 AM  PHQ 2/9 Scores  PHQ - 2 Score 0 0  PHQ- 9 Score  3   Most recent cognitive screening:    03/14/2023    2:01 PM  6CIT Screen  What Year? 0 points  What month? 0 points  Count back from 20 0 points  Months in reverse 0 points  Repeat phrase 0 points   Most recent Audit-C alcohol use screening    03/14/2023    2:03 PM  Alcohol Use Disorder Test (AUDIT)  1. How often do you have a drink containing alcohol? 0  2. How many drinks containing  alcohol do you have on a typical day when you are drinking? 0  3. How often do you have six or more drinks on one occasion? 0  AUDIT-C Score 0   A score of 3 or more in women, and 4 or more in men indicates increased risk for alcohol abuse, EXCEPT if all of the points are from question 1   No results found for any visits on 03/14/23.  Assessment & Plan     Problem List Items Addressed This Visit     Annual physical exam   CAD (coronary artery disease)   Relevant Medications   enalapril (VASOTEC) 2.5 MG tablet   spironolactone (ALDACTONE) 25 MG tablet   rosuvastatin (CRESTOR) 10 MG tablet   Other Relevant Orders   CMP14+EGFR   Lipid panel   COPD (chronic obstructive pulmonary disease) (HCC)   Encounter for annual wellness visit (AWV) in Medicare patient - Primary   Nicotine dependence   Protein-calorie malnutrition, severe   Relevant Orders   Hemoglobin A1c   CMP14+EGFR   TSH+T4F+T3Free   Other Visit Diagnoses     Screening for diabetes mellitus       Relevant Orders   Hemoglobin A1c   Screening for lipid disorders       Relevant Orders   Lipid panel   Ischemic cardiomyopathy       Relevant Medications   enalapril (VASOTEC) 2.5 MG tablet   spironolactone (ALDACTONE) 25 MG tablet   rosuvastatin (CRESTOR) 10 MG tablet        Immunization History  Administered Date(s) Administered   Moderna Sars-Covid-2 Vaccination 11/03/2019, 12/03/2019    Health Maintenance  Topic Date Due   Lung Cancer Screening  Never done   INFLUENZA VACCINE  Never done   COVID-19 Vaccine (3 - 2023-24 season) 03/04/2023   Colonoscopy  03/23/2023 (Originally 10/28/1998)  Hepatitis C Screening  03/23/2023 (Originally 10/28/1971)   Zoster Vaccines- Shingrix (1 of 2) 06/13/2023 (Originally 10/28/2003)   Pneumonia Vaccine 92+ Years old (1 of 2 - PCV) 06/15/2023 (Originally 10/28/1959)   Medicare Annual Wellness (AWV)  03/13/2024   HPV VACCINES  Aged Out   DTaP/Tdap/Td  Discontinued      Return in about 4 months (around 07/14/2023) for COPD.         COPD Improved symptoms with Trelegy. Patient recently quit smoking and noticed significant improvement in breathing with Trelegy. -Continue Trelegy as needed. -Pharmacy to be contacted regarding cost and coverage of Trelegy.  Hypertension and Hyperlipidemia Medication refills needed. -Refill Enalapril, Crestor, and Spironolactone.  General Health Maintenance -Order labs including complete metabolic panel, CBC, cholesterol levels, thyroid levels, and A1c. -Encourage continued smoking cessation. -Encourage daily exercise routine. -Follow up in January 2025.  Dental Health Difficulty with denture fitting leading to dietary restrictions. -Encourage patient to seek resolution with dental provider.  Tremors Patient reports hand tremors, particularly with repetitive movements. -Consider further evaluation if tremors persist or worsen.        Ronnald Ramp, MD  [facility] [phone] (phone) [phone] (fax)  [facility]

## 2023-03-15 ENCOUNTER — Encounter: Payer: Self-pay | Admitting: Family Medicine

## 2023-03-15 LAB — CMP14+EGFR
ALT: 12 IU/L (ref 0–44)
AST: 12 IU/L (ref 0–40)
Albumin: 4 g/dL (ref 3.9–4.9)
Alkaline Phosphatase: 59 IU/L (ref 44–121)
BUN/Creatinine Ratio: 11 (ref 10–24)
BUN: 10 mg/dL (ref 8–27)
Bilirubin Total: 0.3 mg/dL (ref 0.0–1.2)
CO2: 26 mmol/L (ref 20–29)
Calcium: 9.4 mg/dL (ref 8.6–10.2)
Chloride: 93 mmol/L — ABNORMAL LOW (ref 96–106)
Creatinine, Ser: 0.91 mg/dL (ref 0.76–1.27)
Globulin, Total: 2.6 g/dL (ref 1.5–4.5)
Glucose: 84 mg/dL (ref 70–99)
Potassium: 4.4 mmol/L (ref 3.5–5.2)
Sodium: 131 mmol/L — ABNORMAL LOW (ref 134–144)
Total Protein: 6.6 g/dL (ref 6.0–8.5)
eGFR: 91 mL/min/{1.73_m2} (ref >59)

## 2023-03-15 LAB — LIPID PANEL
Chol/HDL Ratio: 3 ratio (ref 0.0–5.0)
Cholesterol, Total: 181 mg/dL (ref 100–199)
HDL: 61 mg/dL (ref >39)
LDL Chol Calc (NIH): 104 mg/dL — ABNORMAL HIGH (ref 0–99)
Triglycerides: 90 mg/dL (ref 0–149)
VLDL Cholesterol Cal: 16 mg/dL (ref 5–40)

## 2023-03-15 LAB — TSH+T4F+T3FREE
Free T4: 1.32 ng/dL (ref 0.82–1.77)
T3, Free: 2.4 pg/mL (ref 2.0–4.4)
TSH: 1.21 u[IU]/mL (ref 0.450–4.500)

## 2023-03-15 LAB — HEMOGLOBIN A1C
Est. average glucose Bld gHb Est-mCnc: 117 mg/dL
Hgb A1c MFr Bld: 5.7 % — ABNORMAL HIGH (ref 4.8–5.6)

## 2023-03-16 ENCOUNTER — Other Ambulatory Visit: Payer: Medicare Other | Admitting: Pharmacist

## 2023-03-16 NOTE — Progress Notes (Signed)
03/16/2023 Name: Guy Bowen MRN: [id number] DOB: 1955-07-07  Chief Complaint  Patient presents with   Medication Assistance    Guy Bowen is a 67 y.o. year old male who presented for a telephone visit.   They were referred to the pharmacist by their PCP for assistance in managing medication access - trelegy   Subjective:  Care Team: Primary Care Provider: Ronnald Ramp, MD ;   Medication Access/Adherence  Current Pharmacy:  [facility] [id number] [place], [place] - [address] [place] [address] Phone: [phone] Fax: [phone]  [facility] - [place], [place] - [address] [place] [address] Phone: [phone] Fax: [phone]   COPD:  Current medications: albuterol PRN inhaler + nebs. Restarted trellegy recently from an old supply of a sample - did not work well for him in the past but has quit smoking and notices it is much more helpful   Objective:  Lab Results  Component Value Date   HGBA1C 5.7 (H) 03/14/2023    Lab Results  Component Value Date   CREATININE 0.91 03/14/2023   BUN 10 03/14/2023   NA 131 (L) 03/14/2023   K 4.4 03/14/2023   CL 93 (L) 03/14/2023   CO2 26 03/14/2023    Lab Results  Component Value Date   CHOL 181 03/14/2023   HDL 61 03/14/2023   LDLCALC 104 (H) 03/14/2023   TRIG 90 03/14/2023   CHOLHDL 3.0 03/14/2023    Medications Reviewed Today     Reviewed by Gabriel Carina, RPH (Pharmacist) on 03/16/23 at 1117  Med List Status: <None>   Medication Order Taking? Sig Documenting Provider Last Dose Status Informant  albuterol (PROVENTIL HFA) 108 (90 Base) MCG/ACT inhaler [id number] Yes Inhale 2 puffs into the lungs once every 6 (six) hours as needed for wheezing. Simmons-Robinson, Tawanna Cooler, MD Taking Active   albuterol (PROVENTIL) (2.5 MG/3ML) 0.083% nebulizer solution [id number] Yes Inhale 3 mLs (2.5 mg total) by nebulization once every 4 (four) hours  as needed for wheezing or shortness of breath. Simmons-Robinson, Tawanna Cooler, MD Taking Active   aspirin 81 MG chewable tablet [id number] Yes Chew 81 mg by mouth daily. [provider] Taking Active Self  enalapril (VASOTEC) 2.5 MG tablet [id number] Yes Take 1 tablet (2.5 mg total) by mouth daily. Simmons-Robinson, Tawanna Cooler, MD Taking Active   Fluticasone-Umeclidin-Vilant (TRELEGY ELLIPTA) 100-62.5-25 MCG/ACT AEPB [id number] Yes Inhale 1 puff into the lungs daily. Simmons-Robinson, Makiera, MD Taking Active   nicotine polacrilex (COMMIT) 4 MG lozenge [id number] Yes Place inside cheek. [provider] Taking Active   rosuvastatin (CRESTOR) 10 MG tablet [id number] Yes Take 1 tablet (10 mg total) by mouth daily. Simmons-Robinson, Makiera, MD Taking Active   spironolactone (ALDACTONE) 25 MG tablet [id number] Yes Take 0.5 tablets (12.5 mg total) by mouth daily. Ronnald Ramp, MD Taking Active   Med List Note Sharia Reeve, [place] 10/04/21 6789): No pharmacy preferance             Assessment/Plan:   COPD: - Currently controlled, facilitating medication access for trelegy cost reduction - Screened for LIS/Extra Help - applied, reentry number: [id number] - Counseled patient on trellegy PAP and out of pocket spend requirement, if LIS denial, will pursue PAP as next step  Follow Up Plan: 2 weeks to assess if pt receives LIS/Extra Help in mail  Lynnda Shields, PharmD, BCPS Clinical Pharmacist [facility]

## 2023-04-04 ENCOUNTER — Other Ambulatory Visit: Payer: Medicare Other | Admitting: Pharmacist

## 2023-04-04 NOTE — Progress Notes (Cosign Needed)
04/04/2023 Name: Guy Bowen MRN: [id number] DOB: 05/20/1956  Chief Complaint  Patient presents with   Medication Assistance    Guy Bowen is a 67 y.o. year old male who presented for a telephone visit.   They were referred to the pharmacist by their PCP for assistance in managing medication access - trelegy.    Of note, recently patient self-discontinued spironolactone due to dizziness. He reports the dizziness has resolved upon discontinuation.  Vitals during today's visit: 132/75, HR 83   Subjective:  Care Team: Primary Care Provider: Ronnald Ramp, MD ;   Medication Access/Adherence  Current Pharmacy:  [facility] [id number] [place], [place] - [address] [place] [address] Phone: [phone] Fax: [phone]  [facility] - [place], [place] - [address] [place] [address] Phone: [phone] Fax: [phone]   COPD:  Current medications: albuterol PRN inhaler + nebs. Restarted trellegy recently from an old supply of a sample - did not work well for him in the past but has quit smoking and notices it is much more helpful   Objective:  Lab Results  Component Value Date   HGBA1C 5.7 (H) 03/14/2023    Lab Results  Component Value Date   CREATININE 0.91 03/14/2023   BUN 10 03/14/2023   NA 131 (L) 03/14/2023   K 4.4 03/14/2023   CL 93 (L) 03/14/2023   CO2 26 03/14/2023    Lab Results  Component Value Date   CHOL 181 03/14/2023   HDL 61 03/14/2023   LDLCALC 104 (H) 03/14/2023   TRIG 90 03/14/2023   CHOLHDL 3.0 03/14/2023    Medications Reviewed Today     Reviewed by Gabriel Carina, RPH (Pharmacist) on 04/04/23 at 1147  Med List Status: <None>   Medication Order Taking? Sig Documenting Provider Last Dose Status Informant  albuterol (PROVENTIL HFA) 108 (90 Base) MCG/ACT inhaler [id number] No Inhale 2 puffs into the lungs once every 6 (six) hours as needed for wheezing.  Simmons-Robinson, Tawanna Cooler, MD Taking Active   albuterol (PROVENTIL) (2.5 MG/3ML) 0.083% nebulizer solution [id number] No Inhale 3 mLs (2.5 mg total) by nebulization once every 4 (four) hours as needed for wheezing or shortness of breath. Simmons-Robinson, Makiera, MD Taking Active   aspirin 81 MG chewable tablet [id number] No Chew 81 mg by mouth daily. [provider] Taking Active Self  enalapril (VASOTEC) 2.5 MG tablet [id number] No Take 1 tablet (2.5 mg total) by mouth daily. Simmons-Robinson, Makiera, MD Taking Active   Fluticasone-Umeclidin-Vilant (TRELEGY ELLIPTA) 100-62.5-25 MCG/ACT AEPB [id number] No Inhale 1 puff into the lungs daily. Simmons-Robinson, Makiera, MD Taking Active   nicotine polacrilex (COMMIT) 4 MG lozenge [id number] No Place inside cheek. [provider] Taking Active   rosuvastatin (CRESTOR) 10 MG tablet [id number] No Take 1 tablet (10 mg total) by mouth daily. Simmons-Robinson, Makiera, MD Taking Active   spironolactone (ALDACTONE) 25 MG tablet [id number] No Take 0.5 tablets (12.5 mg total) by mouth daily. Ronnald Ramp, MD Taking Active   Med List Note Sharia Reeve, [place] 10/04/21 5009): No pharmacy preferance             Assessment/Plan:   COPD: - Currently controlled, - Applied for LIS/Extra Help - needed to resubmit application on today's call. reentry number: [id number] - In meantime, patient is out of trelegy. Recommend switching from trelegy to breztri using patient assistance program, due to no out of pocket spend requirement for breztri's program, whereas  there is an up-front out of pocket spend for trelegy that was prohibitive for him. Will collaborate with PCP and if in agreement, will send to rx med assistance team to initiate application.  Follow Up Plan: 2 weeks to assess if pt receives LIS/Extra Help in mail  Lynnda Shields, PharmD, BCPS Clinical Pharmacist [facility]

## 2023-04-11 ENCOUNTER — Telehealth: Payer: Self-pay

## 2023-04-11 NOTE — Telephone Encounter (Signed)
-----   Message from Gabriel Carina sent at 04/05/2023 11:57 AM EDT ----- Hi,  Can you please initiate  PAP application via AZ&Me for Breztri 2 puffs BID?  Thank you! Thurston Hole

## 2023-04-11 NOTE — Telephone Encounter (Signed)
Mailed AZ&ME application to pts home for [place] assistance.

## 2023-04-24 ENCOUNTER — Other Ambulatory Visit: Payer: Self-pay | Admitting: Pharmacist

## 2023-04-24 NOTE — Progress Notes (Signed)
04/24/2023 Name: Guy Bowen MRN: [id number] DOB: December 08, 1955  Chief Complaint  Patient presents with   Medication Assistance    Guy Bowen is a 67 y.o. year old male who presented for a telephone visit.   They were referred to the pharmacist by their PCP for assistance in managing medication access - trelegy. We have switched from trelegy to [facility] to obtain patient assistance via AZ& Me.  We also applied for medicare LIS/extra help. This application sometimes will opt in for eligibility of any other state programs. Patient reported receiving the following letter in the mail: [facility] DSS Notice of medicaid application determination.  Application for medicaid health coverage - approved for family planning medicaid starting 04/03/2023. Made too much for medication portion. Contact info: [facility] Nigel Bridgeman, [phone]   Subjective:  Care Team: Primary Care Provider: Ronnald Ramp, MD ;   Medication Access/Adherence  Current Pharmacy:  [facility] [id number] - [place], [place] - [address] [place] [address] Phone: [phone] Fax: [phone]  [facility] - [place], [place] - [address] [place] [address] Phone: [phone] Fax: [phone]   COPD:  Current medications: albuterol PRN inhaler + nebs. Restarted trellegy recently from an old supply of a sample - did not work well for him in the past but has quit smoking and notices it is much more helpful   Objective:  Lab Results  Component Value Date   HGBA1C 5.7 (H) 03/14/2023    Lab Results  Component Value Date   CREATININE 0.91 03/14/2023   BUN 10 03/14/2023   NA 131 (L) 03/14/2023   K 4.4 03/14/2023   CL 93 (L) 03/14/2023   CO2 26 03/14/2023    Lab Results  Component Value Date   CHOL 181 03/14/2023   HDL 61 03/14/2023   LDLCALC 104 (H) 03/14/2023   TRIG 90 03/14/2023   CHOLHDL 3.0 03/14/2023     Medications Reviewed Today     Reviewed by Gabriel Carina, RPH (Pharmacist) on 04/24/23 at 1051  Med List Status: <None>   Medication Order Taking? Sig Documenting Provider Last Dose Status Informant  albuterol (PROVENTIL HFA) 108 (90 Base) MCG/ACT inhaler [id number] No Inhale 2 puffs into the lungs once every 6 (six) hours as needed for wheezing. Simmons-Robinson, Tawanna Cooler, MD Taking Active   albuterol (PROVENTIL) (2.5 MG/3ML) 0.083% nebulizer solution [id number] No Inhale 3 mLs (2.5 mg total) by nebulization once every 4 (four) hours as needed for wheezing or shortness of breath. Simmons-Robinson, Makiera, MD Taking Active   aspirin 81 MG chewable tablet [id number] No Chew 81 mg by mouth daily. [provider] Taking Active Self  enalapril (VASOTEC) 2.5 MG tablet [id number] No Take 1 tablet (2.5 mg total) by mouth daily. Simmons-Robinson, Makiera, MD Taking Active   Fluticasone-Umeclidin-Vilant (TRELEGY ELLIPTA) 100-62.5-25 MCG/ACT AEPB [id number] No Inhale 1 puff into the lungs daily. Simmons-Robinson, Makiera, MD Taking Active   nicotine polacrilex (COMMIT) 4 MG lozenge [id number] No Place inside cheek. [provider] Taking Active   rosuvastatin (CRESTOR) 10 MG tablet [id number] No Take 1 tablet (10 mg total) by mouth daily. Simmons-Robinson, Makiera, MD Taking Active   spironolactone (ALDACTONE) 25 MG tablet [id number] No Take 0.5 tablets (12.5 mg total) by mouth daily.  Patient not taking: Reported on 04/04/2023   Ronnald Ramp, MD Not Taking Active   Med List Note Sharia Reeve, [place] 10/04/21 0093): No pharmacy preferance  Assessment/Plan:   COPD: - Currently controlled, - Attempted to contact [place] DSS for update of medicaid family planning benefit that resulted from medicare LIS application. Sat on hold extended period, call disconnected. - In meantime, patient is out of trelegy. We are in process of obtaining Breztri via AZ&Me  program. Instructed patient to fill out paperwork and return to med assistance team using return labeling. Instructed to contact PCP office for sample of breztri or trelegy if desires, this will depend on product availability.  Follow Up Plan: 2 weeks to assess PAP progress   Lynnda Shields, PharmD, BCPS Clinical Pharmacist [facility]

## 2023-04-24 NOTE — Patient Instructions (Signed)
Guy Bowen,  I sat on hold a good while to get more information about the medicaid family planning, but call disconnected. I researched this, and sometimes, the Medicare Extra Help program will also opt in to other state programs if the patient is eligible - I believe this is how it approved your for medicaid family planning. Await their letter + future instructions for if this benefit would be helpful to you at all (aka, if it can be used for other things that are relevant for what you need). Contact the program phone number with questions.  In the meantime, be sure to send back the paperwork for [facility] using the included return envelope.   We will touch base over the phone in a few weeks.  Take care, Elmarie Shiley, PharmD, BCPS Clinical Pharmacist [facility]

## 2023-05-07 ENCOUNTER — Other Ambulatory Visit: Payer: Medicare Other | Admitting: Pharmacist

## 2023-05-07 ENCOUNTER — Telehealth: Payer: Self-pay | Admitting: Family Medicine

## 2023-05-07 NOTE — Telephone Encounter (Signed)
Received a fax from covermymeds for Trelegy Ellipta 100-62.5-25MCG/ACT aerosol Powder  Key:  [id number]

## 2023-05-07 NOTE — Progress Notes (Signed)
05/07/2023 Name: Guy Bowen MRN: [id number] DOB: 09-02-1955  Chief Complaint  Patient presents with   Medication Assistance    Guy Bowen is a 67 y.o. year old male who presented for a telephone visit.   They were referred to the pharmacist by their PCP for assistance in managing medication access - trelegy. We have switched from trelegy to [facility] to obtain patient assistance via [place]& [place]. However, today patient reports he received an additional letter in the mail in response to our application for medicare LIS/extra help - he is approved. This will reduce copays to a maximum $4 generic, and $11.20 for brand name medications - it will automatically apply at his dispensing pharmacy.   Subjective:  Care Team: Primary Care Provider: Ronnald Ramp, MD ;   Medication Access/Adherence  Current Pharmacy:  [facility] [id number] [place], [place] - [address] [place] [address] Phone: [phone] Fax: [phone]  [facility] [place], [place] - [address] [address] Phone: [phone] Fax: [phone]   COPD:  Current medications: albuterol PRN inhaler + nebs. Restarted trellegy recently from an old supply of a sample - did not work well for him in the past but has quit smoking and notices it is much more helpful   Objective:  Lab Results  Component Value Date   HGBA1C 5.7 (H) 03/14/2023    Lab Results  Component Value Date   CREATININE 0.91 03/14/2023   BUN 10 03/14/2023   NA 131 (L) 03/14/2023   K 4.4 03/14/2023   CL 93 (L) 03/14/2023   CO2 26 03/14/2023    Lab Results  Component Value Date   CHOL 181 03/14/2023   HDL 61 03/14/2023   LDLCALC 104 (H) 03/14/2023   TRIG 90 03/14/2023   CHOLHDL 3.0 03/14/2023    Medications Reviewed Today     Reviewed by Gabriel Carina, RPH (Pharmacist) on 05/07/23 at 1456  Med List Status: <None>   Medication Order Taking? Sig Documenting  Provider Last Dose Status Informant  albuterol (PROVENTIL HFA) 108 (90 Base) MCG/ACT inhaler [id number]  Inhale 2 puffs into the lungs once every 6 (six) hours as needed for wheezing. Simmons-Robinson, Tawanna Cooler, MD  Active   albuterol (PROVENTIL) (2.5 MG/3ML) 0.083% nebulizer solution [id number]  Inhale 3 mLs (2.5 mg total) by nebulization once every 4 (four) hours as needed for wheezing or shortness of breath. Simmons-Robinson, Makiera, MD  Active   aspirin 81 MG chewable tablet [id number]  Chew 81 mg by mouth daily. [provider]  Active Self  enalapril (VASOTEC) 2.5 MG tablet [id number]  Take 1 tablet (2.5 mg total) by mouth daily. Simmons-Robinson, Makiera, MD  Active   Fluticasone-Umeclidin-Vilant (TRELEGY ELLIPTA) 100-62.5-25 MCG/ACT AEPB [id number] Yes Inhale 1 puff into the lungs daily. Simmons-Robinson, Makiera, MD Taking Active   nicotine polacrilex (COMMIT) 4 MG lozenge [id number]  Place inside cheek. [provider]  Active   rosuvastatin (CRESTOR) 10 MG tablet [id number]  Take 1 tablet (10 mg total) by mouth daily. Simmons-Robinson, Makiera, MD  Active   spironolactone (ALDACTONE) 25 MG tablet [id number]  Take 0.5 tablets (12.5 mg total) by mouth daily.  Patient not taking: Reported on 04/04/2023   Ronnald Ramp, MD  Active   Med List Note Sharia Reeve, [place] 10/04/21 1696): No pharmacy preferance             Assessment/Plan:   COPD: - Currently controlled, -Contacted dispensing  pharmacy to check on copay of medications and confirm medicare LIS program has automatically applied. Unfortunately due to recent refill, pharmacy was unable to test price any medication. Patient was out of trelegy, provided verbal for prescription. Contacted patient back to relay the information, and that trelegy should be available for him by tomorrow. If for some reason copay is greater than $11, instructed him to contact the social security contact info from the letter,  and if needing pharmacy assistance, can contact our careguide for getting back into pharmacist schedule. He verbalized understanding.  - In future, rx med assistance team can likely cancel breztri patient assistance via AZ&Me, as soon as confirmed trelegy is affordable using recently approved medicare LIS.  Follow Up Plan: none scheduled, patient to outreach if further questions/concerns   Lynnda Shields, PharmD, BCPS Clinical Pharmacist [facility]

## 2023-05-08 NOTE — Telephone Encounter (Signed)
PA initiated

## 2023-05-14 ENCOUNTER — Telehealth: Payer: Self-pay | Admitting: Family Medicine

## 2023-05-14 DIAGNOSIS — I255 Ischemic cardiomyopathy: Secondary | ICD-10-CM

## 2023-05-14 MED ORDER — ROSUVASTATIN CALCIUM 10 MG PO TABS: 10.0000 mg | ORAL_TABLET | Freq: Every day | ORAL | 3 refills | Status: DC

## 2023-05-14 NOTE — Telephone Encounter (Signed)
[facility] pharmacy faxed refill request for the following medications:   rosuvastatin (CRESTOR) 10 MG tablet     Please advise

## 2023-05-16 ENCOUNTER — Telehealth: Payer: Self-pay | Admitting: Family Medicine

## 2023-05-16 NOTE — Telephone Encounter (Signed)
Received fax from Cover My Meds for Rosuvastatin 10 mg.   Key:  [id number]

## 2023-05-17 NOTE — Telephone Encounter (Signed)
Received fax from cover my meds for Trelegy ellipta 100-62.5-25mg c/act aersol powder  KEY:  [id number]

## 2023-05-18 NOTE — Telephone Encounter (Signed)
Patient getting helped by the medication assistance program. According to the pharmacist note trelegy was going to be ready

## 2023-05-22 NOTE — Progress Notes (Signed)
Pharmacy Medication Assistance Program Note    07/31/2023  Patient ID: Guy Bowen, male   DOB: 1955/09/29, 67 y.o.   MRN: [id number]     05/22/2023  Outreach Medication One  Initial Outreach Date (Medication One) 04/05/2023  Manufacturer Medication One [employer]  [employer] [facility] Bretztri  Type of Radiographer, therapeutic Assistance  Date Application Sent to Patient 04/11/2023  Application Items Requested Application  Name of Prescriber Alphia Moh Simmons-Robinson  Date Application Received From Patient 05/16/2023  Application Items Received From Patient Application;Other  Date Application Submitted to Manufacturer 05/22/2023  Method Application Sent to Manufacturer Fax  Patient Assistance Determination Approved  Approval Start Date 05/24/2023  Approval End Date 07/02/2024    Siri Cole, CphT Pharmacy Patient Advocate

## 2023-05-23 ENCOUNTER — Other Ambulatory Visit: Payer: Self-pay | Admitting: Family Medicine

## 2023-05-23 DIAGNOSIS — J438 Other emphysema: Secondary | ICD-10-CM

## 2023-05-23 MED ORDER — BREZTRI AEROSPHERE 160-9-4.8 MCG/ACT IN AERO: 2.0000 | INHALATION_SPRAY | Freq: Two times a day (BID) | RESPIRATORY_TRACT | 11 refills | Status: DC

## 2023-05-23 NOTE — Progress Notes (Signed)
Initial Outreach Date (Medication One) 04/05/2023  Manufacturer Medication One [employer]  [employer] [facility] Bretztri  Type of Assistance Manufacturer Assistance  Date Application Sent to Patient 04/11/2023  Application Items Requested Application  Name of Prescriber Arham Symmonds Simmons-Robinson  Date Application Received From Patient 05/16/2023  Application Items Received From Patient Application;Other  Date Application Submitted to Manufacturer 05/22/2023  Method Application Sent to Manufacturer Fax        RX will need to be added to patients Med list.    Also, could a 90 day RX with refills be e-scribed to [facility] pharmacy? Faxing application to AZ&ME.   Rx for breztri sent to requested pharmacy   Ronnald Ramp, MD

## 2023-05-28 NOTE — Telephone Encounter (Signed)
No Matching Record The patient details and Request Key provided do not match. Please check the information and try again.

## 2023-05-29 NOTE — Telephone Encounter (Signed)
He does not get the rosuvastatin through pt assistance  I recently received a denial for a request for this medication due to no Medicare Pt D coverage

## 2023-05-30 ENCOUNTER — Telehealth: Payer: Self-pay | Admitting: Family Medicine

## 2023-05-30 NOTE — Telephone Encounter (Signed)
I'll send it to Raven to see if she can direct [place].

## 2023-05-30 NOTE — Telephone Encounter (Signed)
Copied from CRM [id number]. Topic: General - Call Back - No Documentation >> May 30, 2023 12:42 PM Gaetano Hawthorne wrote: Reason for CRM: Patient is calling back Raven who was assisting him with some issues he was having with his prescription and the insurance - He got a voicemail but he wasn't certain about the medications that she was referencing. Please call the patient back.

## 2023-06-04 NOTE — Telephone Encounter (Signed)
Recieved a fax from covermymeds for has been started : Rosuvastatin Calcium 10MG  Tablets   key: [id number]

## 2023-06-05 NOTE — Telephone Encounter (Signed)
Recieved a fax from covermymeds is waiting for Rosuvastatin Calcium 10MG  Tablets    key: [id number]

## 2023-06-06 ENCOUNTER — Encounter: Payer: Self-pay | Admitting: Family Medicine

## 2023-06-06 NOTE — Telephone Encounter (Signed)
The patient has confirmed that he has received crestor from [facility].

## 2023-06-06 NOTE — Telephone Encounter (Signed)
Information regarding your request We are unable to process your request for prior authorization for ROSUVASTATIN TAB 10MG  for the above member due to [facility] has a denied request on file for ROSUVASTATIN TAB 10MG  for this member. Please refer to the appeals process outlined in the original denial or contact Prior Authorization Department at [phone] for further questions.

## 2023-07-16 ENCOUNTER — Ambulatory Visit: Payer: Medicare Other | Admitting: Family Medicine

## 2023-07-16 NOTE — Progress Notes (Deleted)
      Established patient visit   Patient: Guy Bowen   DOB: 1956-02-26   68 y.o. Male  MRN: [id number] Visit Date: 07/16/2023  Today's healthcare provider: Rockie Agent, MD   No chief complaint on file.  Subjective       Discussed the use of AI scribe software for clinical note transcription with the patient, who gave verbal consent to proceed.  History of Present Illness             Past Medical History:  Diagnosis Date   Allergy    Arthritis    Asthma    COPD (chronic obstructive pulmonary disease) (HCC)    Heart murmur     Medications: Outpatient Medications Prior to Visit  Medication Sig   albuterol  (PROVENTIL  HFA) 108 (90 Base) MCG/ACT inhaler Inhale 2 puffs into the lungs once every 6 (six) hours as needed for wheezing.   albuterol  (PROVENTIL ) (2.5 MG/3ML) 0.083% nebulizer solution Inhale 3 mLs (2.5 mg total) by nebulization once every 4 (four) hours as needed for wheezing or shortness of breath.   aspirin 81 MG chewable tablet Chew 81 mg by mouth daily.   Budeson-Glycopyrrol-Formoterol (BREZTRI  AEROSPHERE) 160-9-4.8 MCG/ACT AERO Inhale 2 puffs into the lungs 2 (two) times daily.   enalapril  (VASOTEC ) 2.5 MG tablet Take 1 tablet (2.5 mg total) by mouth daily.   nicotine polacrilex (COMMIT) 4 MG lozenge Place inside cheek.   rosuvastatin  (CRESTOR ) 10 MG tablet Take 1 tablet (10 mg total) by mouth daily.   spironolactone  (ALDACTONE ) 25 MG tablet Take 0.5 tablets (12.5 mg total) by mouth daily. (Patient not taking: Reported on 04/04/2023)   No facility-administered medications prior to visit.    Review of Systems  {Insert previous labs (optional):23779} {See past labs  Heme  Chem  Endocrine  Serology  Results Review (optional):1}   Objective    There were no vitals taken for this visit. {Insert last BP/Wt (optional):23777}{See vitals history (optional):1}    Physical Exam  ***  No results found for any visits on 07/16/23.   Assessment & Plan     Problem List Items Addressed This Visit   None   Assessment and Plan              No follow-ups on file.         Rockie Agent, MD  [facility] [phone] (phone) [phone] (fax)  [facility]

## 2023-07-30 ENCOUNTER — Telehealth: Payer: Self-pay | Admitting: Family Medicine

## 2023-07-30 ENCOUNTER — Other Ambulatory Visit: Payer: Self-pay

## 2023-07-30 DIAGNOSIS — I2510 Atherosclerotic heart disease of native coronary artery without angina pectoris: Secondary | ICD-10-CM

## 2023-07-30 NOTE — Telephone Encounter (Signed)
[facility] pharmacy faxed refill request for the following medications:   enalapril (VASOTEC) 2.5 MG tablet     Please advise

## 2023-08-02 MED ORDER — ENALAPRIL MALEATE 2.5 MG PO TABS: 2.5000 mg | ORAL_TABLET | Freq: Every day | ORAL | 3 refills | Status: DC

## 2023-09-08 IMAGING — DX DG CHEST 1V PORT
1 series · 2 of 2 positions shown · non-contrast
Comparison: None.

CLINICAL DATA: Shortness of breath with a history of COPD.

EXAM:
PORTABLE CHEST 1 VIEW

[Series 1: chest ap · 0.14mm/px · 2 of 2 slices shown]
[im 1/2]
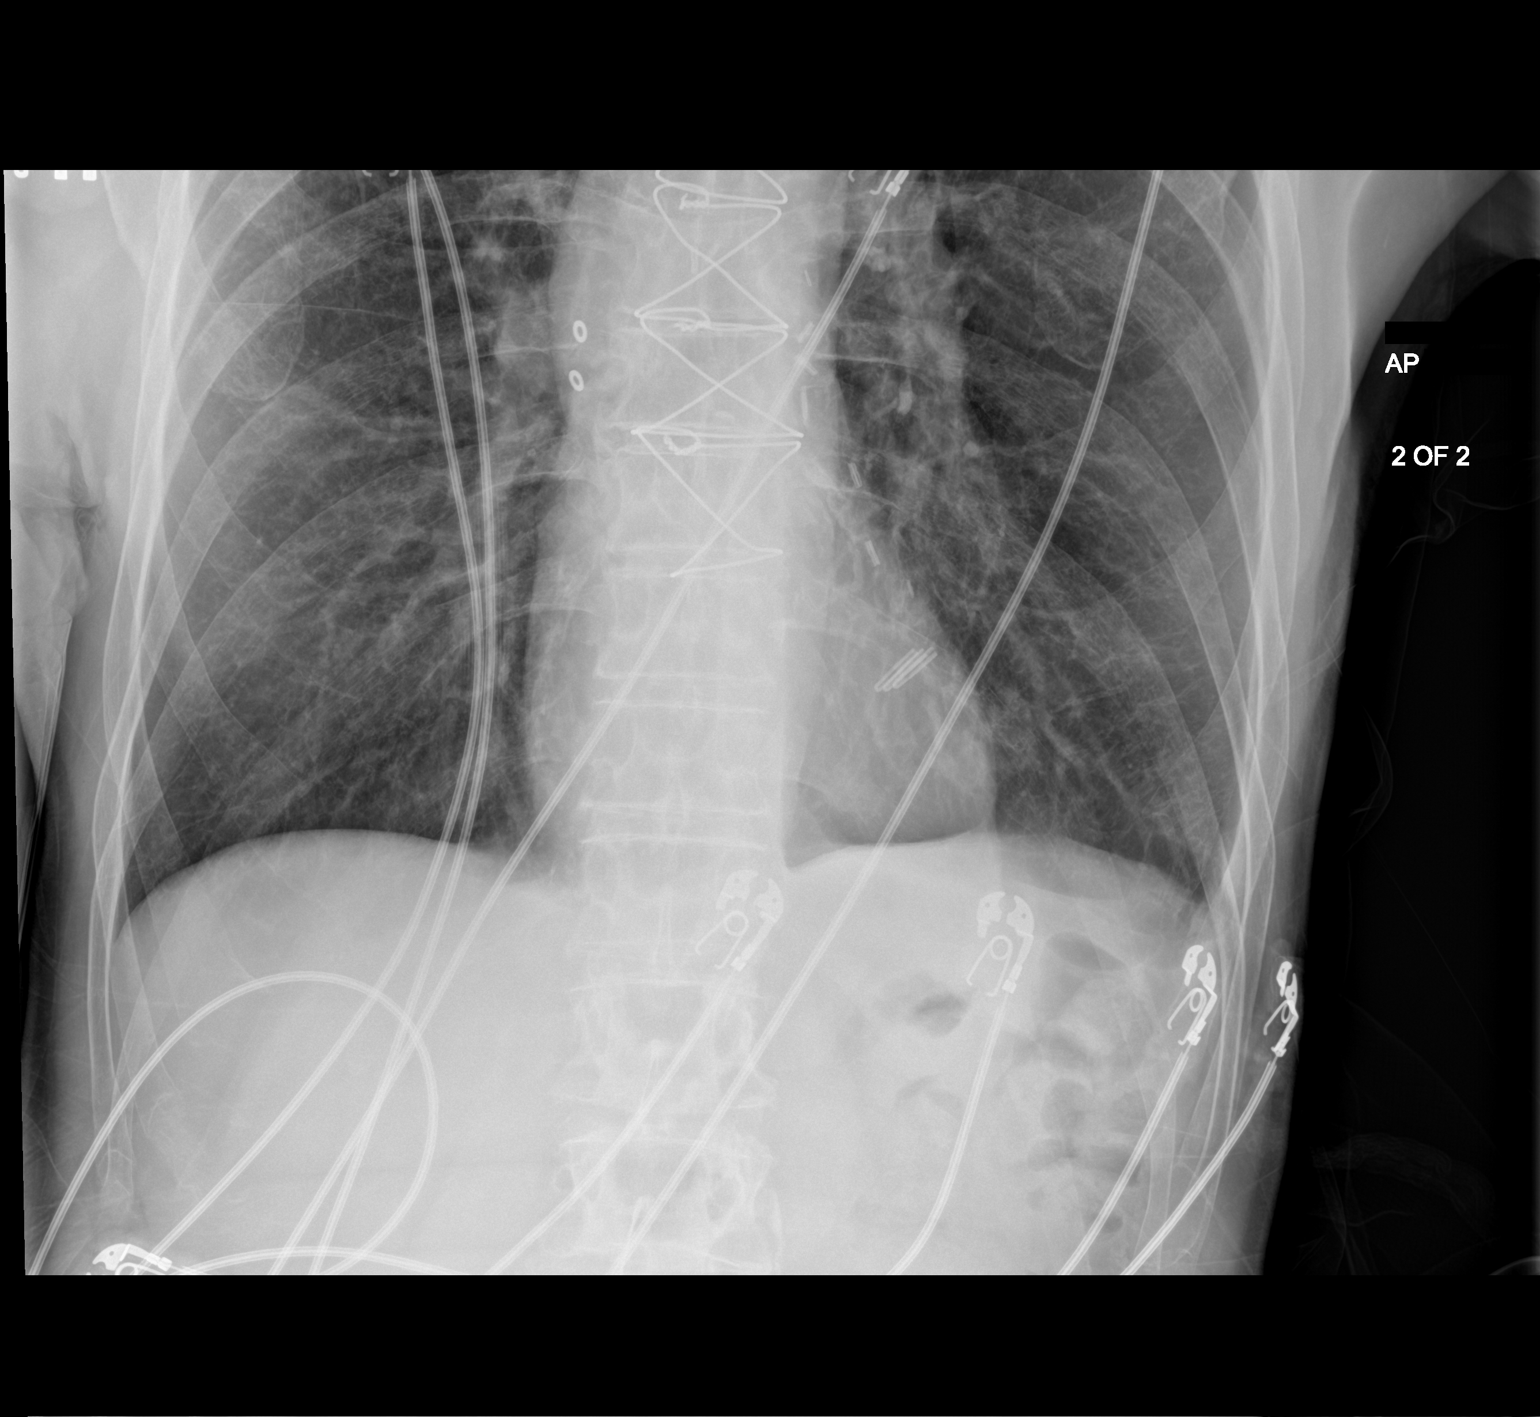
[im 2/2]
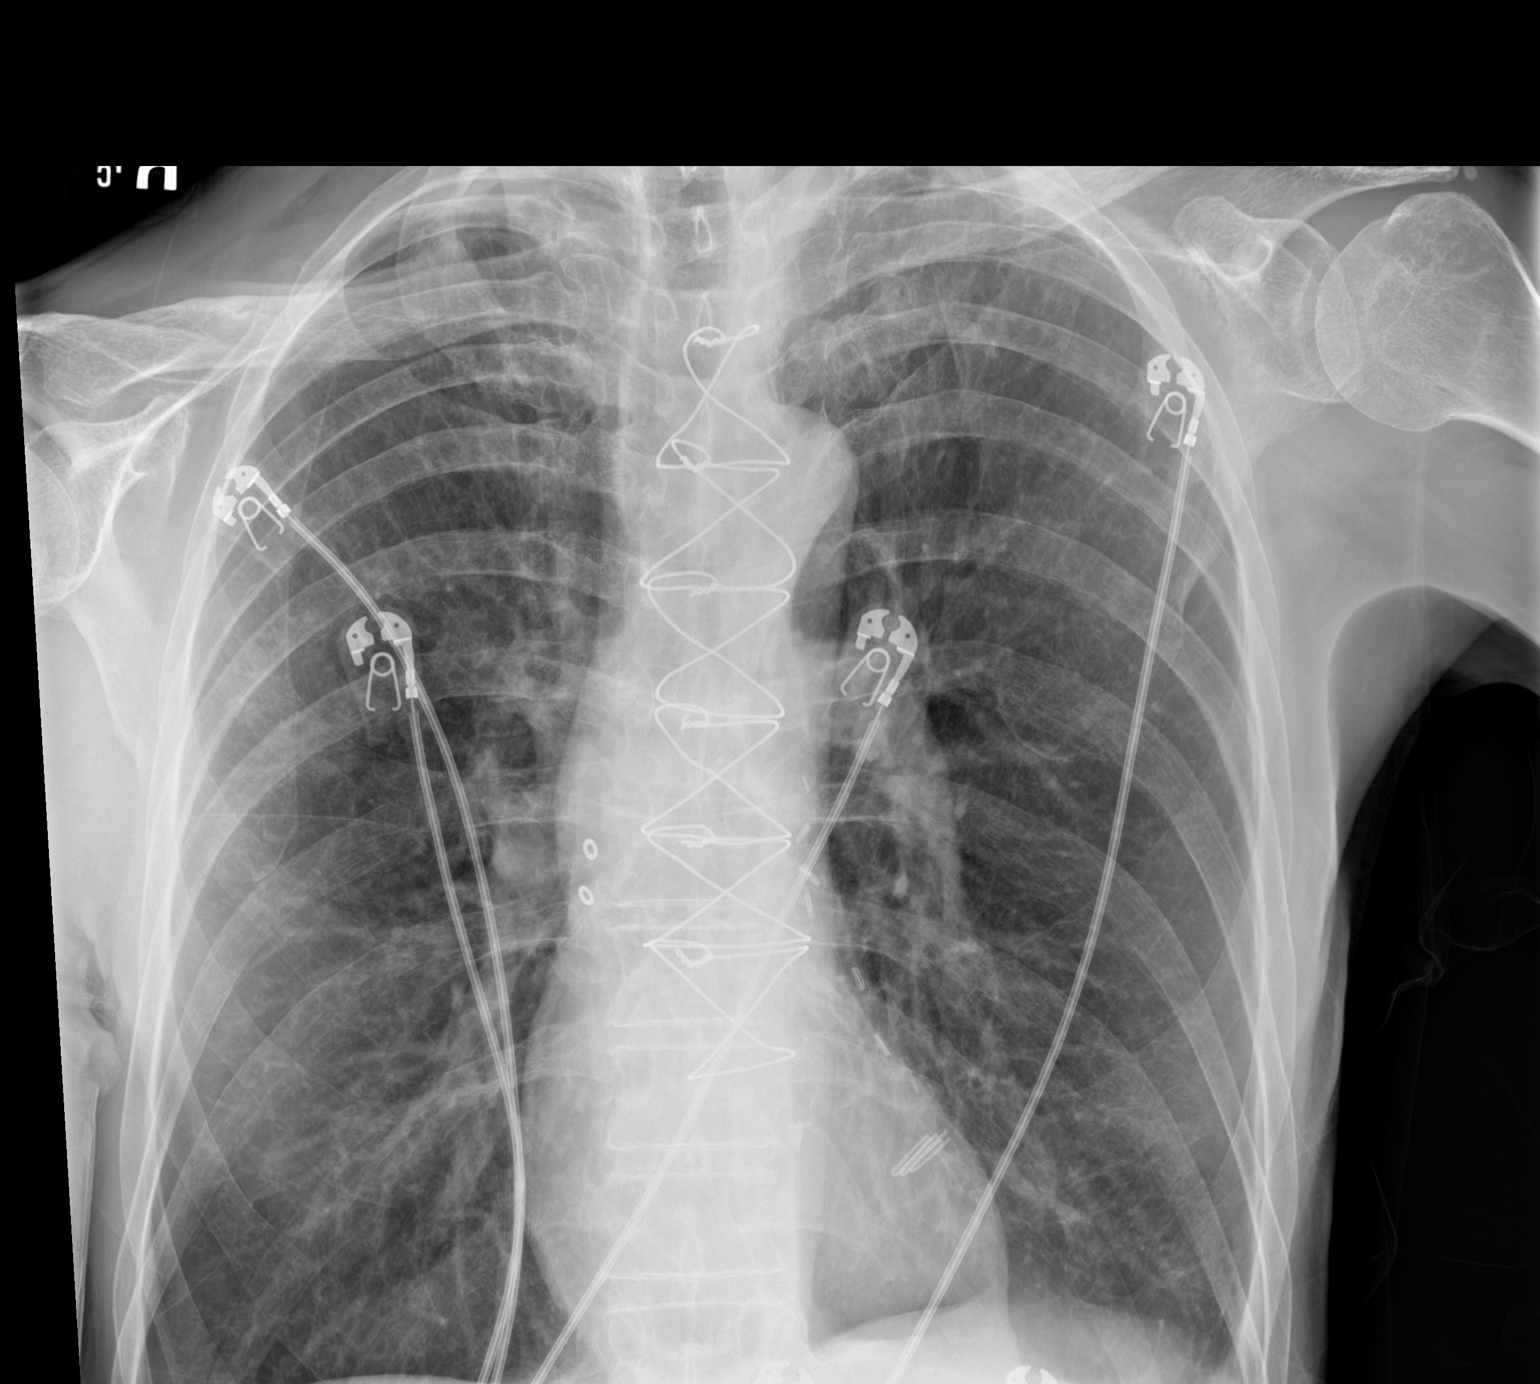

[2 of 2 positions shown; findings below may reference images not displayed]

FINDINGS: There is overlying monitor wiring. There are intact sternotomy
sutures with CABG changes.

Heart size and vasculature are normal apart from mild aortic
atherosclerosis. The lungs are emphysematous but clear. The
mediastinum is normally outlined. Mild osteopenia.
IMPRESSION: No evidence of acute chest disease. COPD. Prior CABG. Aortic
atherosclerosis.

## 2023-09-12 ENCOUNTER — Encounter: Payer: Self-pay | Admitting: Family Medicine

## 2023-09-14 ENCOUNTER — Ambulatory Visit: Admitting: Physician Assistant

## 2023-09-14 ENCOUNTER — Encounter: Payer: Self-pay | Admitting: Physician Assistant

## 2023-09-14 VITALS — BP 131/88 | HR 88 | Temp 98.1°F | Resp 16 | Ht 70.0 in | Wt 141.5 lb

## 2023-09-14 DIAGNOSIS — J438 Other emphysema: Secondary | ICD-10-CM | POA: Diagnosis not present

## 2023-09-14 DIAGNOSIS — R531 Weakness: Secondary | ICD-10-CM

## 2023-09-14 DIAGNOSIS — F17218 Nicotine dependence, cigarettes, with other nicotine-induced disorders: Secondary | ICD-10-CM

## 2023-09-14 DIAGNOSIS — E782 Mixed hyperlipidemia: Secondary | ICD-10-CM

## 2023-09-14 DIAGNOSIS — R7303 Prediabetes: Secondary | ICD-10-CM

## 2023-09-14 DIAGNOSIS — R030 Elevated blood-pressure reading, without diagnosis of hypertension: Secondary | ICD-10-CM

## 2023-09-14 DIAGNOSIS — I255 Ischemic cardiomyopathy: Secondary | ICD-10-CM | POA: Diagnosis not present

## 2023-09-14 DIAGNOSIS — R251 Tremor, unspecified: Secondary | ICD-10-CM

## 2023-09-14 DIAGNOSIS — E43 Unspecified severe protein-calorie malnutrition: Secondary | ICD-10-CM

## 2023-09-14 DIAGNOSIS — I2510 Atherosclerotic heart disease of native coronary artery without angina pectoris: Secondary | ICD-10-CM

## 2023-09-14 DIAGNOSIS — R202 Paresthesia of skin: Secondary | ICD-10-CM

## 2023-09-14 DIAGNOSIS — I6523 Occlusion and stenosis of bilateral carotid arteries: Secondary | ICD-10-CM

## 2023-09-14 DIAGNOSIS — Z0000 Encounter for general adult medical examination without abnormal findings: Secondary | ICD-10-CM

## 2023-09-14 DIAGNOSIS — I499 Cardiac arrhythmia, unspecified: Secondary | ICD-10-CM

## 2023-09-14 MED ORDER — ENALAPRIL MALEATE 2.5 MG PO TABS: 2.5000 mg | ORAL_TABLET | Freq: Every day | ORAL | 3 refills | Status: DC

## 2023-09-14 NOTE — Progress Notes (Signed)
 Established patient visit  Patient: Guy Bowen   DOB: [birth year]   68 y.o. Male  MRN: [id number] Visit Date: 09/14/2023  Today's healthcare provider: Debera Lat, PA-C   Chief Complaint  Patient presents with   Medical management follow-up        Subjective     History of Present Illness           Episode of body tingly happened about  2wks ago. No other concerns  Reports having tingling sensation and weakness in his extremities with spreading all over his body. Unclear about cause.      09/14/2023    3:49 PM 03/14/2023    2:04 PM 08/15/2022   11:33 AM  Depression screen PHQ 2/9  Decreased Interest 0 0 0  Down, Depressed, Hopeless 0 0 0  PHQ - 2 Score 0 0 0  Altered sleeping 2  1  Tired, decreased energy 1  1  Change in appetite 0  1  Feeling bad or failure about yourself  0  0  Trouble concentrating 0  0  Moving slowly or fidgety/restless 0  0  Suicidal thoughts 0  0  PHQ-9 Score 3  3  Difficult doing work/chores Not difficult at all  Not difficult at all      09/14/2023    3:49 PM  GAD 7 : Generalized Anxiety Score  Nervous, Anxious, on Edge 0  Control/stop worrying 0  Worry too much - different things 0  Trouble relaxing 0  Restless 0  Easily annoyed or irritable 0  Afraid - awful might happen 0  Total GAD 7 Score 0  Anxiety Difficulty Not difficult at all    Medications: Outpatient Medications Prior to Visit  Medication Sig   albuterol (PROVENTIL HFA) 108 (90 Base) MCG/ACT inhaler Inhale 2 puffs into the lungs once every 6 (six) hours as needed for wheezing.   albuterol (PROVENTIL) (2.5 MG/3ML) 0.083% nebulizer solution Inhale 3 mLs (2.5 mg total) by nebulization once every 4 (four) hours as needed for wheezing or shortness of breath.   aspirin 81 MG chewable tablet Chew 81 mg by mouth daily.   nicotine polacrilex (COMMIT) 4 MG lozenge Place inside cheek.   rosuvastatin (CRESTOR) 10 MG tablet Take 1 tablet (10 mg total) by mouth daily.    spironolactone (ALDACTONE) 25 MG tablet Take 0.5 tablets (12.5 mg total) by mouth daily.   [DISCONTINUED] enalapril (VASOTEC) 2.5 MG tablet Take 1 tablet (2.5 mg total) by mouth daily.   Budeson-Glycopyrrol-Formoterol (BREZTRI AEROSPHERE) 160-9-4.8 MCG/ACT AERO Inhale 2 puffs into the lungs 2 (two) times daily.   No facility-administered medications prior to visit.    Review of Systems All negative Except see HPI       Objective    BP 131/88 (BP Location: Left Arm, Patient Position: Sitting, Cuff Size: Normal)   Pulse 88   Temp 98.1 F (36.7 C) (Oral)   Resp 16   Ht 5\' 10"  (1.778 m)   Wt 141 lb 8 oz (64.2 kg)   SpO2 100%   BMI 20.30 kg/m     Physical Exam Vitals reviewed.  Constitutional:      General: He is not in acute distress.    Appearance: Normal appearance. He is not diaphoretic.  HENT:     Head: Normocephalic and atraumatic.  Eyes:     General: No scleral icterus.    Conjunctiva/sclera: Conjunctivae normal.  Cardiovascular:     Rate and Rhythm: Normal rate and regular rhythm.  Pulses: Normal pulses.     Heart sounds: Normal heart sounds. No murmur heard. Pulmonary:     Effort: Pulmonary effort is normal. No respiratory distress.     Breath sounds: Normal breath sounds. No wheezing or rhonchi.  Musculoskeletal:     Cervical back: Neck supple.     Right lower leg: No edema.     Left lower leg: No edema.  Lymphadenopathy:     Cervical: No cervical adenopathy.  Skin:    General: Skin is warm and dry.     Findings: No rash.  Neurological:     Mental Status: He is alert and oriented to person, place, and time. Mental status is at baseline.  Psychiatric:        Mood and Affect: Mood normal.        Behavior: Behavior normal.      Results for orders placed or performed in visit on 09/14/23  Comprehensive metabolic panel  Result Value Ref Range   Glucose 90 70 - 99 mg/dL   BUN 11 8 - 27 mg/dL   Creatinine, Ser 0.10 0.76 - 1.27 mg/dL   eGFR 90 >27  OZ/DGU/4.40   BUN/Creatinine Ratio 12 10 - 24   Sodium 126 (L) 134 - 144 mmol/L   Potassium 4.6 3.5 - 5.2 mmol/L   Chloride 89 (L) 96 - 106 mmol/L   CO2 24 20 - 29 mmol/L   Calcium 9.8 8.6 - 10.2 mg/dL   Total Protein 7.1 6.0 - 8.5 g/dL   Albumin 4.5 3.9 - 4.9 g/dL   Globulin, Total 2.6 1.5 - 4.5 g/dL   Bilirubin Total 0.2 0.0 - 1.2 mg/dL   Alkaline Phosphatase 52 44 - 121 IU/L   AST 17 0 - 40 IU/L   ALT 23 0 - 44 IU/L  Hemoglobin A1c  Result Value Ref Range   Hgb A1c MFr Bld 5.8 (H) 4.8 - 5.6 %   Est. average glucose Bld gHb Est-mCnc 120 mg/dL  Lipid panel  Result Value Ref Range   Cholesterol, Total 176 100 - 199 mg/dL   Triglycerides 84 0 - 149 mg/dL   HDL 68 >34 mg/dL   VLDL Cholesterol Cal 15 5 - 40 mg/dL   LDL Chol Calc (NIH) 93 0 - 99 mg/dL   Chol/HDL Ratio 2.6 0.0 - 5.0 ratio  Urinalysis, Routine w reflex microscopic  Result Value Ref Range   Specific Gravity, UA 1.014 1.005 - 1.030   pH, UA 7.0 5.0 - 7.5   Color, UA Yellow Yellow   Appearance Ur Clear Clear   Leukocytes,UA Negative Negative   Protein,UA Negative Negative/Trace   Glucose, UA Negative Negative   Ketones, UA Negative Negative   RBC, UA Negative Negative   Bilirubin, UA Negative Negative   Urobilinogen, Ur 0.2 0.2 - 1.0 mg/dL   Nitrite, UA Negative Negative   Microscopic Examination Comment   CBC with Differential/Platelet  Result Value Ref Range   WBC 6.0 3.4 - 10.8 x10E3/uL   RBC 5.14 4.14 - 5.80 x10E6/uL   Hemoglobin 14.9 13.0 - 17.7 g/dL   Hematocrit 74.2 59.5 - 51.0 %   MCV 86 79 - 97 fL   MCH 29.0 26.6 - 33.0 pg   MCHC 33.6 31.5 - 35.7 g/dL   RDW 63.8 75.6 - 43.3 %   Platelets CANCELED x10E3/uL   Neutrophils 66 Not Estab. %   Lymphs 20 Not Estab. %   Monocytes 10 Not Estab. %   Eos 2 Not  Estab. %   Basos 1 Not Estab. %   Neutrophils Absolute 4.0 1.4 - 7.0 x10E3/uL   Lymphocytes Absolute 1.2 0.7 - 3.1 x10E3/uL   Monocytes Absolute 0.6 0.1 - 0.9 x10E3/uL   EOS (ABSOLUTE) 0.1 0.0 -  0.4 x10E3/uL   Basophils Absolute 0.1 0.0 - 0.2 x10E3/uL   Immature Granulocytes 1 Not Estab. %   Immature Grans (Abs) 0.1 0.0 - 0.1 x10E3/uL   Hematology Comments: Note:   B12 and Folate Panel  Result Value Ref Range   Vitamin B-12 511 232 - 1,245 pg/mL   Folate 7.1 >3.0 ng/mL  TSH  Result Value Ref Range   TSH 2.220 0.450 - 4.500 uIU/mL  Sedimentation rate  Result Value Ref Range   Sed Rate 5 0 - 30 mm/hr        Assessment and Plan   Coronary artery disease involving native coronary artery of native heart without angina pectoris (Primary) Ischemic cardiomyopathy Irregular cardiac rhythm chronic - EKG 12-Lead showed normal sinus rhythm but low voltage of qrs - enalapril (VASOTEC) 2.5 MG tablet; Take 1 tablet (2.5 mg total) by mouth daily.  Dispense: 90 tablet; Refill: 3  Other emphysema (HCC) chronic Continue taking bretzri 160-9-4.8 and albuterol, per pt. Per dispense hx, pt is not taking bretzri? Per chart review, application for Bretzri from 05/23/23 noted Some abnormal lung sounds but pt insists that he fells well, denies having SOB Hx of smoking - Lipid panel Consider cxr if symptoms persist Might consider to prescribe inhaler or continue nebulizer? Will follow-up  Mixed hyperlipidemia Chronic Continue crestor 10 and low cholesterol diet and exercise - Lipid panel Will follow-up  Elevated blood pressure reading Chronic Encouraged cont-ed smoking cessation Encouraged daily exercise, low sodium intake - Comprehensive metabolic panel - EKG 12-Lead Will follow-up  Prediabetes Chronic Encouraged low carb diet and regular exercise - Hemoglobin A1c Will follow-up  Tingling in extremities Acute problem In the past, pt had a similar episodes workup - Comprehensive metabolic panel - Hemoglobin A1c - Lipid panel - Urinalysis, Routine w reflex microscopic - CBC with Differential/Platelet - TSH - B12 and Folate Panel - Sed Rate (ESR) Will  follow-up  Weakness generalized Acute problem Normal neuro exam workup - Comprehensive metabolic panel - Hemoglobin A1c - Lipid panel - Urinalysis, Routine w reflex microscopic - CBC with Differential/Platelet - TSH - B12 and Folate Panel - Sed Rate (ESR) Will follow-up  Carotid artery stenosis Pt mentioned about carotid duplex from 10/07/19: Right Carotid: There is evidence in the ICA of at least a 60 % stenosis.  Left Carotid: Left side occluded.  Pt was advised to follow-up for re evaluation with repeat imaging by vascular on 10/07/19 Pt would like to address this matter at his follow-up Follow-up with PCP  Orders Placed This Encounter  Procedures   Comprehensive metabolic panel    Has the patient fasted?:   Yes   Hemoglobin A1c   Lipid panel    Has the patient fasted?:   Yes   Urinalysis, Routine w reflex microscopic   CBC with Differential/Platelet   TSH   B12 and Folate Panel   Sed Rate (ESR)   CBC with Differential/Platelet   B12 and Folate Panel   TSH   Sedimentation rate   EKG 12-Lead    No follow-ups on file.   The patient was advised to call back or seek an in-person evaluation if the symptoms worsen or if the condition fails to improve as anticipated.  I  discussed the assessment and treatment plan with the patient. The patient was provided an opportunity to ask questions and all were answered. The patient agreed with the plan and demonstrated an understanding of the instructions.  I, Debera Lat, PA-C have reviewed all documentation for this visit. The documentation on 09/14/2023  for the exam, diagnosis, procedures, and orders are all accurate and complete.  Debera Lat, [facility], MMS [facility] [phone] (phone) [phone] (fax)  [facility]

## 2023-09-15 ENCOUNTER — Encounter: Payer: Self-pay | Admitting: Physician Assistant

## 2023-09-15 DIAGNOSIS — R202 Paresthesia of skin: Secondary | ICD-10-CM | POA: Insufficient documentation

## 2023-09-15 DIAGNOSIS — I255 Ischemic cardiomyopathy: Secondary | ICD-10-CM | POA: Insufficient documentation

## 2023-09-15 DIAGNOSIS — R531 Weakness: Secondary | ICD-10-CM | POA: Insufficient documentation

## 2023-09-15 LAB — COMPREHENSIVE METABOLIC PANEL
ALT: 23 IU/L (ref 0–44)
AST: 17 IU/L (ref 0–40)
Albumin: 4.5 g/dL (ref 3.9–4.9)
Alkaline Phosphatase: 52 IU/L (ref 44–121)
BUN/Creatinine Ratio: 12 (ref 10–24)
BUN: 11 mg/dL (ref 8–27)
Bilirubin Total: 0.2 mg/dL (ref 0.0–1.2)
CO2: 24 mmol/L (ref 20–29)
Calcium: 9.8 mg/dL (ref 8.6–10.2)
Chloride: 89 mmol/L — ABNORMAL LOW (ref 96–106)
Creatinine, Ser: 0.92 mg/dL (ref 0.76–1.27)
Globulin, Total: 2.6 g/dL (ref 1.5–4.5)
Glucose: 90 mg/dL (ref 70–99)
Potassium: 4.6 mmol/L (ref 3.5–5.2)
Sodium: 126 mmol/L — ABNORMAL LOW (ref 134–144)
Total Protein: 7.1 g/dL (ref 6.0–8.5)
eGFR: 90 mL/min/{1.73_m2} (ref >59)

## 2023-09-15 LAB — CBC WITH DIFFERENTIAL/PLATELET
Basophils Absolute: 0.1 10*3/uL (ref 0.0–0.2)
Basos: 1 %
EOS (ABSOLUTE): 0.1 10*3/uL (ref 0.0–0.4)
Eos: 2 %
Hematocrit: 44.3 % (ref 37.5–51.0)
Hemoglobin: 14.9 g/dL (ref 13.0–17.7)
Immature Grans (Abs): 0.1 10*3/uL (ref 0.0–0.1)
Immature Granulocytes: 1 %
Lymphocytes Absolute: 1.2 10*3/uL (ref 0.7–3.1)
Lymphs: 20 %
MCH: 29 pg (ref 26.6–33.0)
MCHC: 33.6 g/dL (ref 31.5–35.7)
MCV: 86 fL (ref 79–97)
Monocytes Absolute: 0.6 10*3/uL (ref 0.1–0.9)
Monocytes: 10 %
Neutrophils Absolute: 4 10*3/uL (ref 1.4–7.0)
Neutrophils: 66 %
RBC: 5.14 x10E6/uL (ref 4.14–5.80)
RDW: 13.8 % (ref 11.6–15.4)
WBC: 6 10*3/uL (ref 3.4–10.8)

## 2023-09-15 LAB — LIPID PANEL
Chol/HDL Ratio: 2.6 ratio (ref 0.0–5.0)
Cholesterol, Total: 176 mg/dL (ref 100–199)
HDL: 68 mg/dL (ref >39)
LDL Chol Calc (NIH): 93 mg/dL (ref 0–99)
Triglycerides: 84 mg/dL (ref 0–149)
VLDL Cholesterol Cal: 15 mg/dL (ref 5–40)

## 2023-09-15 LAB — B12 AND FOLATE PANEL
Folate: 7.1 ng/mL (ref >3.0)
Vitamin B-12: 511 pg/mL (ref 232–1245)

## 2023-09-15 LAB — URINALYSIS, ROUTINE W REFLEX MICROSCOPIC
Bilirubin, UA: NEGATIVE
Glucose, UA: NEGATIVE
Ketones, UA: NEGATIVE
Leukocytes,UA: NEGATIVE
Nitrite, UA: NEGATIVE
Protein,UA: NEGATIVE
RBC, UA: NEGATIVE
Specific Gravity, UA: 1.014 (ref 1.005–1.030)
Urobilinogen, Ur: 0.2 mg/dL (ref 0.2–1.0)
pH, UA: 7 (ref 5.0–7.5)

## 2023-09-15 LAB — HEMOGLOBIN A1C
Est. average glucose Bld gHb Est-mCnc: 120 mg/dL
Hgb A1c MFr Bld: 5.8 % — ABNORMAL HIGH (ref 4.8–5.6)

## 2023-09-15 LAB — SEDIMENTATION RATE: Sed Rate: 5 mm/h (ref 0–30)

## 2023-09-15 LAB — TSH: TSH: 2.22 u[IU]/mL (ref 0.450–4.500)

## 2023-09-17 ENCOUNTER — Encounter: Payer: Self-pay | Admitting: Physician Assistant

## 2023-10-05 ENCOUNTER — Ambulatory Visit: Admitting: Family Medicine

## 2023-10-16 ENCOUNTER — Ambulatory Visit: Admitting: Family Medicine

## 2023-10-16 ENCOUNTER — Encounter: Payer: Self-pay | Admitting: Family Medicine

## 2023-10-16 VITALS — BP 160/64 | HR 80 | Ht 70.0 in | Wt 142.0 lb

## 2023-10-16 DIAGNOSIS — I2510 Atherosclerotic heart disease of native coronary artery without angina pectoris: Secondary | ICD-10-CM

## 2023-10-16 DIAGNOSIS — Z87891 Personal history of nicotine dependence: Secondary | ICD-10-CM

## 2023-10-16 DIAGNOSIS — J438 Other emphysema: Secondary | ICD-10-CM

## 2023-10-16 DIAGNOSIS — I10 Essential (primary) hypertension: Secondary | ICD-10-CM | POA: Diagnosis not present

## 2023-10-16 DIAGNOSIS — R259 Unspecified abnormal involuntary movements: Secondary | ICD-10-CM | POA: Diagnosis not present

## 2023-10-16 MED ORDER — ROSUVASTATIN CALCIUM 20 MG PO TABS: 20.0000 mg | ORAL_TABLET | Freq: Every day | ORAL | 3 refills | Status: AC

## 2023-10-16 MED ORDER — ENALAPRIL MALEATE 5 MG PO TABS
5.0000 mg | ORAL_TABLET | Freq: Every day | ORAL | 1 refills | Status: DC
Start: 2023-10-16 — End: 2024-03-17

## 2023-10-16 NOTE — Progress Notes (Signed)
 Established patient visit   Patient: Guy Bowen   DOB: January 13, 1956   68 y.o. Male  MRN: [id number] Visit Date: 10/16/2023  Today's healthcare provider: Ronnald Ramp, MD   Chief Complaint  Patient presents with   Follow-up    No concerns    Subjective     HPI     Follow-up    Additional comments: No concerns       Last edited by Thedora Hinders, CMA on 10/16/2023  3:31 PM.       Discussed the use of AI scribe software for clinical note transcription with the patient, who gave verbal consent to proceed.  History of Present Illness Guy Bowen is a 68 year old male with hypertension and COPD who presents for follow-up.  His blood pressure readings at home range between 125-135/70-80 mmHg, although today's reading was elevated at 173/82 mmHg. He is currently taking enalapril 2.5 mg daily for hypertension. He previously experienced dizziness with spironolactone, which he discontinued a year and a half ago. No current dizziness or lightheadedness on enalapril.  He has a history of COPD and reports significant improvement in his breathing since starting [place], which he takes as two puffs in the morning and two puffs at night. He has not smoked in six months and uses two to three nicotine lozenges daily. He previously smoked two packs a day.  He mentions episodes of dizziness and weakness, particularly when standing up quickly, which resolve upon sitting. He describes a sensation of losing control over his limbs, especially his legs, and has experienced tremors in his right hand. These episodes have been infrequent, with the last occurrence over a year ago.  He is on a statin for cholesterol management and has requested an increase in dosage from 10 mg to 20 mg. He has a history of coronary artery disease and has noted improvements in his cholesterol levels.  He has a family history of smoking, as his sister continues to smoke one or two cigarettes a  day. He has no family history of colon cancer and is hesitant about colon cancer screening.     Past Medical History:  Diagnosis Date   Allergy    Arthritis    Asthma    COPD (chronic obstructive pulmonary disease) (HCC)    Heart murmur     Medications: Outpatient Medications Prior to Visit  Medication Sig   albuterol (PROVENTIL HFA) 108 (90 Base) MCG/ACT inhaler Inhale 2 puffs into the lungs once every 6 (six) hours as needed for wheezing.   albuterol (PROVENTIL) (2.5 MG/3ML) 0.083% nebulizer solution Inhale 3 mLs (2.5 mg total) by nebulization once every 4 (four) hours as needed for wheezing or shortness of breath.   aspirin 81 MG chewable tablet Chew 81 mg by mouth daily.   nicotine polacrilex (COMMIT) 4 MG lozenge Place inside cheek.   [DISCONTINUED] enalapril (VASOTEC) 2.5 MG tablet Take 1 tablet (2.5 mg total) by mouth daily.   [DISCONTINUED] rosuvastatin (CRESTOR) 10 MG tablet Take 1 tablet (10 mg total) by mouth daily.   Budeson-Glycopyrrol-Formoterol (BREZTRI AEROSPHERE) 160-9-4.8 MCG/ACT AERO Inhale 2 puffs into the lungs 2 (two) times daily.   [DISCONTINUED] spironolactone (ALDACTONE) 25 MG tablet Take 0.5 tablets (12.5 mg total) by mouth daily. (Patient not taking: Reported on 10/16/2023)   No facility-administered medications prior to visit.    Review of Systems  Last CBC Lab Results  Component Value Date   WBC 6.0 09/14/2023   HGB 14.9 09/14/2023  HCT 44.3 09/14/2023   MCV 86 09/14/2023   MCH 29.0 09/14/2023   RDW 13.8 09/14/2023   PLT CANCELED 09/14/2023   Last metabolic panel Lab Results  Component Value Date   GLUCOSE 90 09/14/2023   NA 126 (L) 09/14/2023   K 4.6 09/14/2023   CL 89 (L) 09/14/2023   CO2 24 09/14/2023   BUN 11 09/14/2023   CREATININE 0.92 09/14/2023   EGFR 90 09/14/2023   CALCIUM 9.8 09/14/2023   PROT 7.1 09/14/2023   ALBUMIN 4.5 09/14/2023   LABGLOB 2.6 09/14/2023   AGRATIO 1.9 08/15/2022   BILITOT 0.2 09/14/2023   ALKPHOS 52  09/14/2023   AST 17 09/14/2023   ALT 23 09/14/2023   ANIONGAP 8 06/18/2022   Last lipids Lab Results  Component Value Date   CHOL 176 09/14/2023   HDL 68 09/14/2023   LDLCALC 93 09/14/2023   TRIG 84 09/14/2023   CHOLHDL 2.6 09/14/2023  The ASCVD Risk score (Arnett DK, et al., 2019) failed to calculate for the following reasons:   Risk score cannot be calculated because patient has a medical history suggesting prior/existing ASCVD   Last hemoglobin A1c Lab Results  Component Value Date   HGBA1C 5.8 (H) 09/14/2023   Last thyroid functions Lab Results  Component Value Date   TSH 2.220 09/14/2023        Objective    BP (!) 160/64 (Cuff Size: Normal)   Pulse 80   Ht 5\' 10"  (1.778 m)   Wt 142 lb (64.4 kg)   SpO2 99%   BMI 20.37 kg/m  BP Readings from Last 3 Encounters:  10/16/23 (!) 160/64  09/14/23 131/88  03/14/23 127/77   Wt Readings from Last 3 Encounters:  10/16/23 142 lb (64.4 kg)  09/14/23 141 lb 8 oz (64.2 kg)  03/14/23 139 lb 6.4 oz (63.2 kg)        Physical Exam  EXTRM: no LE edema NEURO: 4/5 strength in bilateral upper extremities, normal, normal finger to nose, observed abnormal movement of RUE while pt was writing his name today, pt ambulates with 4 prong cane, no asterixis noted on exam   No results found for any visits on 10/16/23.  Assessment & Plan     Problem List Items Addressed This Visit       Cardiovascular and Mediastinum   Primary hypertension   Relevant Medications   rosuvastatin (CRESTOR) 20 MG tablet   enalapril (VASOTEC) 5 MG tablet   Other Relevant Orders   Ambulatory referral to Neurology   CAD (coronary artery disease)   Relevant Medications   rosuvastatin (CRESTOR) 20 MG tablet   enalapril (VASOTEC) 5 MG tablet     Respiratory   COPD (chronic obstructive pulmonary disease) (HCC)     Other   History of smoking greater than 50 pack years   Relevant Orders   Ambulatory Referral for Lung Cancer Screening [REF832]    Abnormal involuntary movements - Primary    Assessment & Plan Hypertension Chronic Hypertension is not well-controlled with a blood pressure of 173/82 mmHg in the office, though home readings range from 125-135/70-80 mmHg. He is on enalapril 2.5 mg daily without dizziness or lightheadedness. - Increase enalapril to 5 mg once daily - Monitor blood pressure at home regularly - Follow up in one month to reassess blood pressure control  Coronary Artery Disease (CAD) He is on Crestor 10 mg and requested an increase to manage cholesterol levels better. Increasing to 20 mg is reasonable given  his CAD history and management goals. - Increase Crestor to 20 mg daily - Send prescription to [facility] with a year's worth of refills  Chronic Obstructive Pulmonary Disease (COPD) He uses Breztri, which has improved breathing and reduced coughing. He has not smoked in six months and uses nicotine lozenges. Pneumococcal vaccination was discussed to prevent invasive pneumonia. - Continue Breztri as prescribed - Encourage continued smoking cessation - Order annual low-dose chest CT for lung cancer screening - Discuss and provide information on pneumococcal vaccination  Tremor and Episodic Weakness Chronic He reports right hand tremor and episodic leg weakness, which are concerning. A referral to neurology is warranted for evaluation of potential neurological disorders. - Refer to neurology for evaluation of tremor and episodic weakness  General Health Maintenance Preventive health measures, including lung cancer screening and vaccinations, were discussed. He agreed to lung cancer screening but declined colon cancer screening. Pneumococcal vaccine was recommended due to COPD and smoking history. - Order low-dose chest CT for lung cancer screening - Patient declined colon cancer screening  Follow-up Reassess conditions and management plans in a month. - Schedule follow-up appointment in one  month     Return in about 1 month (around 11/15/2023) for HTN,abnormal movements .         Mimi Alt, MD  [facility] [phone] (phone) [phone] (fax)  [facility]

## 2023-10-16 NOTE — Patient Instructions (Signed)
 VISIT SUMMARY:  Today, we reviewed your hypertension, coronary artery disease, COPD, and episodes of dizziness and weakness. We discussed your current medications and made some adjustments to better manage your conditions. Preventive health measures, including lung cancer screening and vaccinations, were also discussed.  YOUR PLAN:  -HYPERTENSION: Hypertension means high blood pressure. Your blood pressure was elevated today, so we are increasing your enalapril dose to 5 mg once daily. Please monitor your blood pressure at home regularly and follow up in one month to reassess your blood pressure control.  -CORONARY ARTERY DISEASE (CAD): Coronary artery disease is a condition where the blood vessels supplying your heart are narrowed or blocked. We are increasing your Crestor dose to 20 mg daily to better manage your cholesterol levels. Your prescription will be sent to [facility] pharmacy with a year's worth of refills.  -CHRONIC OBSTRUCTIVE PULMONARY DISEASE (COPD): COPD is a chronic lung disease that makes it hard to breathe. Continue using Breztri as prescribed, and keep up the great work with smoking cessation. We will order an annual low-dose chest CT for lung cancer screening and discuss pneumococcal vaccination to prevent pneumonia.  -TREMOR AND EPISODIC WEAKNESS: You have reported episodes of dizziness, weakness, and tremors in your right hand. These symptoms are concerning, so we will refer you to a neurologist for further evaluation.  INSTRUCTIONS:  Please schedule a follow-up appointment in one month to reassess your conditions and management plans. Continue monitoring your blood pressure at home and keep track of any new symptoms. We will also proceed with the lung cancer screening and discuss the pneumococcal vaccination at your next visit.

## 2023-10-22 ENCOUNTER — Encounter: Payer: Self-pay | Admitting: Family Medicine

## 2023-10-22 NOTE — Telephone Encounter (Signed)
Please see the message below from the pt .

## 2023-11-01 ENCOUNTER — Other Ambulatory Visit: Payer: Self-pay

## 2023-11-01 ENCOUNTER — Telehealth: Payer: Self-pay | Admitting: Acute Care

## 2023-11-01 DIAGNOSIS — Z122 Encounter for screening for malignant neoplasm of respiratory organs: Secondary | ICD-10-CM

## 2023-11-01 DIAGNOSIS — Z87891 Personal history of nicotine dependence: Secondary | ICD-10-CM

## 2023-11-01 NOTE — Telephone Encounter (Signed)
 Lung Cancer Screening Narrative/Criteria Questionnaire (Cigarette Smokers Only- No Cigars/Pipes/vapes)   Guy Bowen   SDMV:11/02/23 at 0900 NATALIE                                           1956-05-02              LDCT: 11/12/23 at 1130am /     68 y.o.   Phone: [phone]  Lung Screening Narrative (confirm age 25-77 yrs Medicare / 50-80 yrs Private pay insurance)   Insurance information:[employer] medicare   Referring Provider:Simmons   This screening involves an initial phone call with a team member from our program. It is called a shared decision making visit. The initial meeting is required by insurance and Medicare to make sure you understand the program. This appointment takes about 15-20 minutes to complete. The CT scan will completed at a separate date/time. This scan takes about 5-10 minutes to complete and you may eat and drink before and after the scan.  Criteria questions for Lung Cancer Screening:   Are you a current or former smoker? Former Age began smoking: 68yo   If you are a former smoker, what year did you quit smoking? {2024   To calculate your smoking history, I need an accurate estimate of how many packs of cigarettes you smoked per day and for how many years. (Not just the number of PPD you are now smoking)   Years smoking 52 x Packs per day 1/2 to 1.5 = Pack years 52   (at least 20 pack yrs)   (Make sure they understand that we need to know how much they have smoked in the past, not just the number of PPD they are smoking now)  Do you have a personal history of cancer?  No    Do you have a family history of cancer? No  Are you coughing up blood?  No  Have you had unexplained weight loss of 15 lbs or more in the last 6 months? No  It looks like you meet all criteria.     Additional information: N/A

## 2023-11-02 ENCOUNTER — Ambulatory Visit (INDEPENDENT_AMBULATORY_CARE_PROVIDER_SITE_OTHER): Admitting: Acute Care

## 2023-11-02 DIAGNOSIS — Z87891 Personal history of nicotine dependence: Secondary | ICD-10-CM

## 2023-11-02 NOTE — Patient Instructions (Signed)

## 2023-11-02 NOTE — Progress Notes (Unsigned)
 Virtual Visit via Telephone Note  I connected with Guy Bowen on 11/02/23 at  9:00 AM EDT by telephone and verified that I am speaking with the correct person using two identifiers.  Location: Patient: Guy Bowen Provider: Alyse Bach, RN   I discussed the limitations, risks, security and privacy concerns of performing an evaluation and management service by telephone and the availability of in person appointments. I also discussed with the patient that there may be a patient responsible charge related to this service. The patient expressed understanding and agreed to proceed.    Shared Decision Making Visit Lung Cancer Screening Program [phone])   Eligibility: Age 68 y.o. Pack Years Smoking History Calculation 51 (# packs/per year x # years smoked) Recent History of coughing up blood  no Unexplained weight loss? no ( >Than 15 pounds within the last 6 months ) Prior History Lung / other cancer no (Diagnosis within the last 5 years already requiring surveillance chest CT Scans). Smoking Status Former Smoker Former Smokers: Years since quit: 1 year  Quit Date: 2024  Visit Components: Discussion included one or more decision making aids. yes Discussion included risk/benefits of screening. yes Discussion included potential follow up diagnostic testing for abnormal scans. yes Discussion included meaning and risk of over diagnosis. yes Discussion included meaning and risk of False Positives. yes Discussion included meaning of total radiation exposure. yes  Counseling Included: Importance of adherence to annual lung cancer LDCT screening. yes Impact of comorbidities on ability to participate in the program. yes Ability and willingness to under diagnostic treatment. yes  Smoking Cessation Counseling: Current Smokers:  Discussed importance of smoking cessation. yes Information about tobacco cessation classes and interventions provided to patient. yes Patient provided  with "ticket" for LDCT Scan. no Symptomatic Patient. no  Counseling(Intermediate counseling: > three minutes) 99406 Diagnosis Code: Tobacco Use Z72.0 Asymptomatic Patient yes  Counseling (Intermediate counseling: > three minutes counseling) U0454 Former Smokers:  Discussed the importance of maintaining cigarette abstinence. yes Diagnosis Code: Personal History of Nicotine Dependence. U98.119 Information about tobacco cessation classes and interventions provided to patient. Yes Patient provided with "ticket" for LDCT Scan. no Written Order for Lung Cancer Screening with LDCT placed in Epic. Yes (CT Chest Lung Cancer Screening Low Dose W/O CM) [id number] Z12.2-Screening of respiratory organs Z87.891-Personal history of nicotine dependence   Alyse Bach, RN

## 2023-11-12 ENCOUNTER — Ambulatory Visit
Admission: RE | Admit: 2023-11-12 | Discharge: 2023-11-12 | Disposition: A | Discharge status: Home / Self Care | Source: Ambulatory Visit | Attending: Acute Care | Admitting: Acute Care

## 2023-11-12 DIAGNOSIS — Z122 Encounter for screening for malignant neoplasm of respiratory organs: Secondary | ICD-10-CM | POA: Diagnosis not present

## 2023-11-12 DIAGNOSIS — Z87891 Personal history of nicotine dependence: Secondary | ICD-10-CM | POA: Insufficient documentation

## 2023-11-13 ENCOUNTER — Ambulatory Visit: Admitting: Family Medicine

## 2023-11-13 ENCOUNTER — Encounter: Payer: Self-pay | Admitting: Family Medicine

## 2023-11-13 VITALS — BP 97/64 | HR 99 | Ht 70.0 in | Wt 141.0 lb

## 2023-11-13 DIAGNOSIS — I10 Essential (primary) hypertension: Secondary | ICD-10-CM | POA: Diagnosis not present

## 2023-11-13 DIAGNOSIS — R259 Unspecified abnormal involuntary movements: Secondary | ICD-10-CM

## 2023-11-13 DIAGNOSIS — E782 Mixed hyperlipidemia: Secondary | ICD-10-CM | POA: Diagnosis not present

## 2023-11-13 DIAGNOSIS — I2510 Atherosclerotic heart disease of native coronary artery without angina pectoris: Secondary | ICD-10-CM | POA: Diagnosis not present

## 2023-11-13 NOTE — Progress Notes (Signed)
 Established patient visit   Patient: Guy Bowen   DOB: March 11, 1956   68 y.o. Male  MRN: [id number] Visit Date: 11/13/2023  Today's healthcare provider: Mimi Alt, MD   Chief Complaint  Patient presents with   Follow-up    involuntary movement, he has not experimented any issues since his last visit    Subjective     HPI     Follow-up    Additional comments: involuntary movement, he has not experimented any issues since his last visit       Last edited by Bart Lieu, CMA on 11/13/2023  2:02 PM.       Discussed the use of AI scribe software for clinical note transcription with the patient, who gave verbal consent to proceed.  History of Present Illness Guy Bowen is a 68 year old male who presents for follow-up of involuntary movements.  He has not experienced any further episodes of involuntary movements since his last visit. Previously, these episodes resolved quickly upon sitting down. No lightheadedness has been noted.  He underwent a low dose CT scan for his history of smoking, with results pending. His last lab work in March showed normal B12, folate, thyroid  studies, and cholesterol levels, with a prediabetic A1c of 5.8 and an unremarkable metabolic panel.  He has a history of COPD and is currently using Breztri , two puffs in the morning and at night, which has improved his breathing. He also uses albuterol  four times a day. He experiences shortness of breath when climbing stairs but notes improvement with the current medication regimen.  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 He speculates that these may contribute to his symptoms.  He is on Crestor  20 mg for cholesterol management, which he feels has been effective. He recalls a past ultrasound indicating significant arterial blockage, but no intervention was performed at that time.     Past Medical  History:  Diagnosis Date   Allergy    Arthritis    Asthma    COPD (chronic obstructive pulmonary disease) (HCC)    Heart murmur     Medications: Outpatient Medications Prior to Visit  Medication Sig   albuterol  (PROVENTIL  HFA) 108 (90 Base) MCG/ACT inhaler Inhale 2 puffs into the lungs once every 6 (six) hours as needed for wheezing.   albuterol  (PROVENTIL ) (2.5 MG/3ML) 0.083% nebulizer solution Inhale 3 mLs (2.5 mg total) by nebulization once every 4 (four) hours as needed for wheezing or shortness of breath.   aspirin 81 MG chewable tablet Chew 81 mg by mouth daily.   Budeson-Glycopyrrol-Formoterol (BREZTRI  AEROSPHERE) 160-9-4.8 MCG/ACT AERO Inhale 2 puffs into the lungs 2 (two) times daily.   enalapril  (VASOTEC ) 5 MG tablet Take 1 tablet (5 mg total) by mouth daily.   nicotine polacrilex (COMMIT) 4 MG lozenge Place inside cheek.   rosuvastatin  (CRESTOR ) 20 MG tablet Take 1 tablet (20 mg total) by mouth daily.   No facility-administered medications prior to visit.    Review of Systems  Last CBC Lab Results  Component Value Date   WBC 6.0 09/14/2023   HGB 14.9 09/14/2023   HCT 44.3 09/14/2023   MCV 86 09/14/2023   MCH 29.0 09/14/2023   RDW 13.8 09/14/2023   PLT CANCELED 09/14/2023   Last metabolic panel Lab Results  Component Value Date   GLUCOSE 90 09/14/2023   NA 126 (L) 09/14/2023  K 4.6 09/14/2023   CL 89 (L) 09/14/2023   CO2 24 09/14/2023   BUN 11 09/14/2023   CREATININE 0.92 09/14/2023   EGFR 90 09/14/2023   CALCIUM  9.8 09/14/2023   PROT 7.1 09/14/2023   ALBUMIN 4.5 09/14/2023   LABGLOB 2.6 09/14/2023   AGRATIO 1.9 08/15/2022   BILITOT 0.2 09/14/2023   ALKPHOS 52 09/14/2023   AST 17 09/14/2023   ALT 23 09/14/2023   ANIONGAP 8 06/18/2022   Last lipids Lab Results  Component Value Date   CHOL 176 09/14/2023   HDL 68 09/14/2023   LDLCALC 93 09/14/2023   TRIG 84 09/14/2023   CHOLHDL 2.6 09/14/2023   The ASCVD Risk score (Arnett DK, et al., 2019)  failed to calculate for the following reasons:   Risk score cannot be calculated because patient has a medical history suggesting prior/existing ASCVD  Last hemoglobin A1c Lab Results  Component Value Date   HGBA1C 5.8 (H) 09/14/2023   Last thyroid  functions Lab Results  Component Value Date   TSH 2.220 09/14/2023   Last vitamin D No results found for: "25OHVITD2", "25OHVITD3", "VD25OH" Last vitamin B12 and Folate Lab Results  Component Value Date   VITAMINB12 511 09/14/2023   FOLATE 7.1 09/14/2023     {See past labs  Heme  Chem  Endocrine  Serology  Results Review (optional):1}   Objective    BP 97/64   Pulse 99   Ht 5\' 10"  (1.778 m)   Wt 141 lb (64 kg)   SpO2 98%   BMI 20.23 kg/m  BP Readings from Last 3 Encounters:  11/13/23 97/64  10/16/23 (!) 160/64  09/14/23 131/88   Wt Readings from Last 3 Encounters:  11/13/23 141 lb (64 kg)  11/12/23 140 lb (63.5 kg)  10/16/23 142 lb (64.4 kg)    {See vitals history (optional):1}    Physical Exam  General: Alert, no acute distress Cardio: Normal S1 and S2, RRR, no r/m/g Pulm: CTAB, normal work of breathing   No results found for any visits on 11/13/23.  Assessment & Plan     Problem List Items Addressed This Visit   None    Assessment & Plan Chronic Obstructive Pulmonary Disease (COPD) COPD is well-managed with Breztri , showing improved breathing. Occasional dyspnea occurs, particularly when climbing stairs. Albuterol  is used four times daily, which is suboptimal. - Continue Breztri  as prescribed - Continue albuterol  as needed for acute symptoms  Involuntary Movement Episodes No further episodes since the last visit. Differential diagnosis includes neurological causes such as seizures or old stroke. Neurology referral is pending. Episodes resolve quickly upon sitting, possibly related to neck issues with metal plates. - Expedite neurology appointment  Vertebral Fractures Vertebral fractures with  metal plates in the neck. No current issues reported. Possible contribution to involuntary movement episodes.  Hypertension Blood pressure is 97/64 mmHg, typical for him. Previous reading was 160/64 mmHg. No symptoms of lightheadedness or dizziness.  Hyperlipidemia Managed with Crestor  20 mg, with reported improvement. Previous ultrasound showed significant arterial blockage, but no intervention was performed. Improvement noted with current statin therapy. - Continue Crestor  20 mg daily     No follow-ups on file.         Mimi Alt, MD  [facility] [phone] (phone) [phone] (fax)  [facility]

## 2023-11-18 ENCOUNTER — Other Ambulatory Visit: Payer: Self-pay | Admitting: Family Medicine

## 2023-11-18 DIAGNOSIS — I255 Ischemic cardiomyopathy: Secondary | ICD-10-CM

## 2023-11-23 ENCOUNTER — Telehealth: Payer: Self-pay | Admitting: Pharmacist

## 2023-11-23 ENCOUNTER — Other Ambulatory Visit: Payer: Self-pay | Admitting: Family Medicine

## 2023-11-23 DIAGNOSIS — J438 Other emphysema: Secondary | ICD-10-CM

## 2023-11-23 MED ORDER — BREZTRI AEROSPHERE 160-9-4.8 MCG/ACT IN AERO: 2.0000 | INHALATION_SPRAY | Freq: Two times a day (BID) | RESPIRATORY_TRACT | 11 refills | Status: DC

## 2023-11-23 NOTE — Progress Notes (Signed)
   11/23/2023  Patient ID: Guy Bowen, male   DOB: [birth year], 68 y.o.   MRN: [id number]  Received message regarding Breztri  application and needing a new refill.  Called [facility]. They report getting a refill script sent on 11/20/23 for another 3 month supply, but will need another refill following this one again.  Advised the original script was sent for a year supply on the application. Unknown as to how patient could have ran out. Don't think representative understood the explanation.  Will just plan to send another Rx refill in about 2 weeks for when it's due to next time after verifying he received his shipment.   Delvin File, PharmD [facility]  Phone Number: [phone]

## 2023-12-05 ENCOUNTER — Telehealth: Payer: Self-pay

## 2023-12-05 ENCOUNTER — Other Ambulatory Visit: Payer: Self-pay

## 2023-12-05 DIAGNOSIS — Z87891 Personal history of nicotine dependence: Secondary | ICD-10-CM

## 2023-12-05 DIAGNOSIS — Z122 Encounter for screening for malignant neoplasm of respiratory organs: Secondary | ICD-10-CM

## 2023-12-05 NOTE — Telephone Encounter (Signed)
 Pt returned call. Recent Lung CT results reviewed. He will complete his annual scan next year. Pt will discuss incidental findings with PCP. Pt denies any increase in shortness of breath or mucous.      IMPRESSION: 1. Lung-RADS 1, negative. Continue annual screening with low-dose chest CT without contrast in 12 months. 2. 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 3. Small bilateral adrenal adenomas. 4.  Aortic atherosclerosis (ICD10-I70.0). 5.  Emphysema (ICD10-J43.9).

## 2023-12-05 NOTE — Telephone Encounter (Signed)
 Called patient, LVM to review results.

## 2023-12-07 MED ORDER — BREZTRI AEROSPHERE 160-9-4.8 MCG/ACT IN AERO: 2.0000 | INHALATION_SPRAY | Freq: Two times a day (BID) | RESPIRATORY_TRACT | 3 refills | Status: DC

## 2023-12-07 NOTE — Addendum Note (Signed)
 Addended by: Jaquila Santelli M on: 12/07/2023 12:46 PM   Modules accepted: Orders

## 2024-03-17 ENCOUNTER — Encounter: Payer: Self-pay | Admitting: Family Medicine

## 2024-03-17 ENCOUNTER — Ambulatory Visit (INDEPENDENT_AMBULATORY_CARE_PROVIDER_SITE_OTHER): Admitting: Family Medicine

## 2024-03-17 VITALS — BP 126/80 | HR 86 | Temp 98.6°F | Ht 70.0 in | Wt 146.0 lb

## 2024-03-17 DIAGNOSIS — I214 Non-ST elevation (NSTEMI) myocardial infarction: Secondary | ICD-10-CM | POA: Diagnosis not present

## 2024-03-17 DIAGNOSIS — J438 Other emphysema: Secondary | ICD-10-CM | POA: Diagnosis not present

## 2024-03-17 DIAGNOSIS — I10 Essential (primary) hypertension: Secondary | ICD-10-CM | POA: Diagnosis not present

## 2024-03-17 DIAGNOSIS — E782 Mixed hyperlipidemia: Secondary | ICD-10-CM | POA: Diagnosis not present

## 2024-03-17 DIAGNOSIS — Z0001 Encounter for general adult medical examination with abnormal findings: Secondary | ICD-10-CM | POA: Diagnosis not present

## 2024-03-17 DIAGNOSIS — Z130 Encounter for screening for diseases of the blood and blood-forming organs and certain disorders involving the immune mechanism: Secondary | ICD-10-CM

## 2024-03-17 DIAGNOSIS — I255 Ischemic cardiomyopathy: Secondary | ICD-10-CM | POA: Diagnosis not present

## 2024-03-17 DIAGNOSIS — Z0000 Encounter for general adult medical examination without abnormal findings: Secondary | ICD-10-CM

## 2024-03-17 DIAGNOSIS — D696 Thrombocytopenia, unspecified: Secondary | ICD-10-CM | POA: Diagnosis not present

## 2024-03-17 DIAGNOSIS — I2510 Atherosclerotic heart disease of native coronary artery without angina pectoris: Secondary | ICD-10-CM

## 2024-03-17 NOTE — Patient Instructions (Signed)
 It was a pleasure to see you today!  Thank you for choosing [facility] for your primary care.   Today you were seen for your annual physical  Please review the attached information regarding helpful preventive health topics.   To keep you healthy, please keep in mind the following health maintenance items that you are due for:   Health Maintenance Due  Topic Date Due   Hepatitis C Screening  Never done   Pneumococcal Vaccine: 50+ Years (1 of 2 - PCV) Never done   Zoster Vaccines- Shingrix (1 of 2) Never done   Influenza Vaccine  Never done     Best Wishes,   Dr. Lang

## 2024-03-17 NOTE — Progress Notes (Unsigned)
 Subjective:   Guy Bowen is a 68 y.o. male who presents for Medicare Annual/Subsequent preventive examination.  Visit Complete: In person  Patient Medicare AWV questionnaire was completed by the patient on 03/17/24; I have confirmed that all information answered by patient is correct and no changes since this date.  Discussed the use of AI scribe software for clinical note transcription with the patient, who gave verbal consent to proceed.  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  He experiences worsening shortness of breath during activities such as making coffee, walking to the car, and grocery shopping, necessitating support from a cart. He desires greater independence in these activities. He uses Breztri  daily, both in the morning and at night, and occasionally uses albuterol  when feeling out of breath. He has not consulted a lung specialist since starting Breztri .  He has a history of COPD and emphysema, with previous lung scans indicating scarring from past pneumonia. He reports that his shortness of breath with activity has worsened, though he breathes normally at rest. No increased coughing or fever recently, but he is concerned about his ability to perform daily activities due to shortness of breath.  He has a history of ischemic cardiomyopathy and is concerned about his heart's pumping efficiency, potentially contributing to his symptoms. He has stopped smoking cigarettes but continues to use lozenges occasionally. He recalls a past treadmill test where he fell due to the speed being too fast.  He has a history of thrombocytopenia, hyperlipidemia, hypertension, and protein-calorie malnutrition, which has improved. He received new glasses about four months ago and is satisfied with their performance. He requested a handicap placard due to his shortness of breath.   Cardiac Risk Factors  include: male gender;advanced age (>108men, >4 women)     Objective:    Today's Vitals   03/17/24 1432 03/17/24 1434  BP:  126/80  Pulse:  86  Temp:  98.6 F (37 C)  TempSrc: Oral Oral  SpO2:  99%  Weight: 146 lb (66.2 kg) 146 lb (66.2 kg)  Height: 5' 10 (1.778 m) 5' 10 (1.778 m)  PainSc:  2    Body mass index is 20.95 kg/m.     06/18/2022    6:58 AM 10/04/2021    6:00 PM 10/04/2021    5:54 AM  Advanced Directives  Does Patient Have a Medical Advance Directive? No No No  Would patient like information on creating a medical advance directive?  No - Patient declined     Current Medications (verified) Outpatient Encounter Medications as of 03/17/2024  Medication Sig   albuterol  (PROVENTIL  HFA) 108 (90 Base) MCG/ACT inhaler Inhale 2 puffs into the lungs once every 6 (six) hours as needed for wheezing.   aspirin 81 MG chewable tablet Chew 81 mg by mouth daily.   budesonide -glycopyrrolate-formoterol (BREZTRI  AEROSPHERE) 160-9-4.8 MCG/ACT AERO inhaler Inhale 2 puffs into the lungs 2 (two) times daily.   nicotine polacrilex (COMMIT) 4 MG lozenge Place inside cheek.   rosuvastatin  (CRESTOR ) 20 MG tablet Take 1 tablet (20 mg total) by mouth daily.   [DISCONTINUED] enalapril  (VASOTEC ) 5 MG tablet Take 1 tablet (5 mg total) by mouth daily.   No facility-administered encounter medications on file as of 03/17/2024.    Allergies (verified) Penicillins and Codeine   History: Past Medical History:  Diagnosis Date   Allergy    Arthritis    Asthma  COPD (chronic obstructive pulmonary disease) (HCC)    Heart murmur    Past Surgical History:  Procedure Laterality Date   CORONARY ARTERY BYPASS GRAFT     HERNIA REPAIR     VASECTOMY     Family History  Problem Relation Age of Onset   Emphysema Mother    Heart failure Father    Cancer Maternal Grandmother    Social History   Socioeconomic History   Marital status: Widowed    Spouse name: Not on file   Number of children:  Not on file   Years of education: Not on file   Highest education level: Not on file  Occupational History   Not on file  Tobacco Use   Smoking status: Former    Current packs/day: 0.00    Average packs/day: 1 pack/day for 51.0 years (51.0 ttl pk-yrs)    Types: Cigarettes    Start date: 16    Quit date: 2024    Years since quitting: 1.7   Smokeless tobacco: Never   Tobacco comments:    1-2 cigarettes daily 05/19/2022  Substance and Sexual Activity   Alcohol use: Not on file   Drug use: Never   Sexual activity: Never  Other Topics Concern   Not on file  Social History Narrative   ** Merged History Encounter **       Social Drivers of Health   Financial Resource Strain: Low Risk  (10/16/2023)   Overall Financial Resource Strain (CARDIA)    Difficulty of Paying Living Expenses: Not hard at all  Food Insecurity: No Food Insecurity (10/16/2023)   Hunger Vital Sign    Worried About Running Out of Food in the Last Year: Never true    Ran Out of Food in the Last Year: Never true  Transportation Needs: No Transportation Needs (10/16/2023)   PRAPARE - Administrator, Civil Service (Medical): No    Lack of Transportation (Non-Medical): No  Physical Activity: Not on file  Stress: No Stress Concern Present (10/16/2023)   [employer] of Occupational Health - Occupational Stress Questionnaire    Feeling of Stress : Only a little  Social Connections: Not on file    Tobacco Counseling Counseling given: Not Answered Tobacco comments: 1-2 cigarettes daily 05/19/2022   Clinical Intake:     Pain : 0-10 Pain Score: 2  Pain Type: Chronic pain Pain Location: Shoulder Pain Orientation: Left Pain Onset: More than a month ago Pain Frequency: Occasional Pain Relieving Factors: Aspirin  Pain Relieving Factors: Aspirin  BMI - recorded: 20.95 Nutritional Status: BMI of 19-24  Normal Diabetes: No  How often do you need to have someone help you when you read  instructions, pamphlets, or other written materials from your doctor or pharmacy?: 1 - Never  Interpreter Needed?: No      Activities of Daily Living    03/17/2024    2:36 PM  In your present state of health, do you have any difficulty performing the following activities:  Hearing? 0  Vision? 0  Difficulty concentrating or making decisions? 0  Walking or climbing stairs? 1  Dressing or bathing? 0  Doing errands, shopping? 0  Preparing Food and eating ? N  Using the Toilet? N  In the past six months, have you accidently leaked urine? N  Do you have problems with loss of bowel control? N  Managing your Medications? N  Managing your Finances? N  Housekeeping or managing your Housekeeping? N  Physical Exam Vitals reviewed.  Constitutional:      General: He is not in acute distress.    Appearance: Normal appearance. He is not ill-appearing.  Cardiovascular:     Rate and Rhythm: Normal rate and regular rhythm.  Pulmonary:     Effort: Pulmonary effort is normal. No respiratory distress.     Breath sounds: No wheezing, rhonchi or rales.  Neurological:     Mental Status: He is alert and oriented to person, place, and time.  Psychiatric:        Mood and Affect: Mood normal.        Behavior: Behavior normal.     Patient Care Team: Sharma Coyer, MD as PCP - General (Family Medicine)  Indicate any recent Medical Services you may have received from other than [facility] providers in the past year (date may be approximate).     Assessment:   This is a routine wellness examination for Guy Bowen.  Hearing/Vision screen No results found.   Goals Addressed   None    Depression Screen    03/17/2024    2:39 PM 11/13/2023    2:03 PM 10/16/2023    3:33 PM 09/14/2023    3:49 PM 03/14/2023    2:04 PM 08/15/2022   11:33 AM 03/08/2022    1:57 PM  PHQ 2/9 Scores  PHQ - 2 Score 0 1 0 0 0 0 0  PHQ- 9 Score 2 5  3  3 3     Fall Risk    03/17/2024    2:38 PM 03/14/2023     2:02 PM 08/15/2022   11:32 AM 06/14/2022   10:09 AM 03/08/2022    1:57 PM  Fall Risk   Falls in the past year? 0 0 0 0 0  Number falls in past yr: 0 0 0 0 0  Injury with Fall? 0 0 0 0 0  Risk for fall due to : No Fall Risks  No Fall Risks No Fall Risks No Fall Risks  Follow up Falls evaluation completed        MEDICARE RISK AT HOME: Medicare Risk at Home Any stairs in or around the home?: No If so, are there any without handrails?: No Home free of loose throw rugs in walkways, pet beds, electrical cords, etc?: Yes Adequate lighting in your home to reduce risk of falls?: Yes Life alert?: No Use of a cane, walker or w/c?: Yes Grab bars in the bathroom?: Yes Shower chair or bench in shower?: Yes Elevated toilet seat or a handicapped toilet?: Yes  TIMED UP AND GO:  Was the test performed?  No    Cognitive Function:        03/17/2024    2:41 PM 03/14/2023    2:01 PM  6CIT Screen  What Year? 0 points 0 points  What month? 0 points 0 points  What time? 0 points   Count back from 20 0 points 0 points  Months in reverse 0 points 0 points  Repeat phrase 0 points 0 points  Total Score 0 points     Immunizations Immunization History  Administered Date(s) Administered   Moderna Sars-Covid-2 Vaccination 11/03/2019, 12/03/2019    TDAP status: Due, Education has been provided regarding the importance of this vaccine. Advised may receive this vaccine at local pharmacy or Health Dept. Aware to provide a copy of the vaccination record if obtained from local pharmacy or Health Dept. Verbalized acceptance and understanding.  Flu Vaccine status: Declined, Education has been  provided regarding the importance of this vaccine but patient still declined. Advised may receive this vaccine at local pharmacy or Health Dept. Aware to provide a copy of the vaccination record if obtained from local pharmacy or Health Dept. Verbalized acceptance and understanding.  Pneumococcal vaccine status:  Declined,  Education has been provided regarding the importance of this vaccine but patient still declined. Advised may receive this vaccine at local pharmacy or Health Dept. Aware to provide a copy of the vaccination record if obtained from local pharmacy or Health Dept. Verbalized acceptance and understanding.   Covid-19 vaccine status: Declined, Education has been provided regarding the importance of this vaccine but patient still declined. Advised may receive this vaccine at local pharmacy or Health Dept.or vaccine clinic. Aware to provide a copy of the vaccination record if obtained from local pharmacy or Health Dept. Verbalized acceptance and understanding.  Qualifies for Shingles Vaccine? Yes   Zostavax completed No   Shingrix Completed?: No.    Education has been provided regarding the importance of this vaccine. Patient has been advised to call insurance company to determine out of pocket expense if they have not yet received this vaccine. Advised may also receive vaccine at local pharmacy or Health Dept. Verbalized acceptance and understanding.  Screening Tests Health Maintenance  Topic Date Due   Hepatitis C Screening  Never done   Zoster Vaccines- Shingrix (1 of 2) Never done   Influenza Vaccine  Never done   COVID-19 Vaccine (3 - 2025-26 season) 04/02/2024 (Originally 03/03/2024)   Colonoscopy  10/15/2024 (Originally 10/28/1998)   Pneumococcal Vaccine: 50+ Years (1 of 2 - PCV) 03/17/2025 (Originally 10/27/1972)   Lung Cancer Screening  11/11/2024   Medicare Annual Wellness (AWV)  03/17/2025   HPV VACCINES  Aged Out   Meningococcal B Vaccine  Aged Out   DTaP/Tdap/Td  Discontinued    Health Maintenance  Health Maintenance Due  Topic Date Due   Hepatitis C Screening  Never done   Zoster Vaccines- Shingrix (1 of 2) Never done   Influenza Vaccine  Never done    Colorectal cancer screening: Type of screening: Colonoscopy. Completed No record of prior results. Recommended for next  year in 2026   Lung Cancer Screening: (Low Dose CT Chest recommended if Age 85-80 years, 20 pack-year currently smoking OR have quit w/in 15years.) does qualify.   Lung Cancer Screening Referral: already has a referral   Additional Screening:  Hepatitis C Screening: does qualify; Completed not yet  Vision Screening: Recommended annual ophthalmology exams for early detection of glaucoma and other disorders of the eye. Is the patient up to date with their annual eye exam?  Yes  Who is the provider or what is the name of the office in which the patient attends annual eye exams? [facility]  If pt is not established with a provider, would they like to be referred to a provider to establish care? No .   Dental Screening: Recommended annual dental exams for proper oral hygiene        Plan:     I have personally reviewed and noted the following in the patient's chart:   Medical and social history Use of alcohol, tobacco or illicit drugs  Current medications and supplements including opioid prescriptions. Patient is not currently taking opioid prescriptions. Functional ability and status Nutritional status Physical activity Advanced directives List of other physicians Hospitalizations, surgeries, and ER visits in previous 12 months Vitals Screenings to include cognitive, depression, and falls Referrals  and appointments  In addition, I have reviewed and discussed with patient certain preventive protocols, quality metrics, and best practice recommendations. A written personalized care plan for preventive services as well as general preventive health recommendations were provided to patient.   Assessment and Plan Assessment & Plan Chronic obstructive pulmonary disease with emphysema Worsening COPD with increased dyspnea on exertion. Concerns about oxygenation during activities. Regular use of Breztri  and occasional albuterol  for acute relief. No recent pulmonology  follow-up. 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 No recent cardiology follow-up. Smoking cessation and reduced nicotine lozenge use reported. - Refer to cardiologist for evaluation of cardiac function  Essential hypertension Hypertension management not specifically addressed in this visit.  Mixed hyperlipidemia Lipid levels to be monitored, labs deferred.  Thrombocytopenia Thrombocytopenia noted.  Protein-calorie malnutrition, improved Protein-calorie malnutrition, no specific issues addressed.  Adult Wellness Visit Annual wellness visit conducted. Recommended screenings and vaccinations discussed. Pt declines recommendations for vaccines  - Recommend hepatitis C screening - Recommend pneumococcal vaccine - Recommend shingles vaccine - Recommend influenza vaccine  Goals of Care Completed do not resuscitate form per request.    Rockie Agent, MD   03/19/2024

## 2024-03-27 NOTE — Progress Notes (Unsigned)
 Cardiology Office Note  Date:  03/28/2024   ID:  Guy Bowen, DOB May 17, 1956, MRN [id number]  PCP:  Sharma Coyer, MD   Chief Complaint  Patient presents with   New Patient (Initial Visit)    Ref by Dr. Sharma for a history of CAD- s/p CABG x 3 2015 @ [facility] ischemic cardiomyopathy & hyperlipidemia.   Patient c/o shortness of breath with little to no activity, dizziness and weakness.      HPI:  Guy Bowen a 68 y.o. male with past medical history of: History of smoking, COPD History of spinal injury, status post surgery Hyperlipidemia PAD, occluded left carotid Right carotid 40 to 59% stenosis CAD, July 09, 2019 CTO right, 90% mid LCX, 80% mid LAD CABG 1/21 Who presents by referral from Dr. Marcine Essex with coronary artery disease, ischemic cardiomyopathy, hyperlipidemia  On disc [place] today, he reports history of coronary disease, CABG Has been seen by cardiology locally, wished to change to [facility]  Prior studies reviewed Echo April 2023 EF 60 to 65% No valvular heart disease  Echo January 2021 EF greater than 55%  Does not remember cardiac cath in 07/08/1998 Details discussed with him from [facility] records 3-vessel coronary artery disease including 90% mid-LCx, 80% mid-LAD, and CTO of the RCA.  EF greater  55% Was referred to CT surgery for consideration of CABG  Reports undergoing CABG in 2021 Has done well since that time  CT scan chest May 2025 Aorta atherosclerosis  On discussion of his conditioning, reports that he feels weak,  Chronic SOB, still smokes occasionally  Flu and PNA in 12/23 In the hospital for prolonged period of time, slow recovery, continues to feel weak  Echo stress test in 2024 with Dr. Fernand Rasmussen on treadmill  Lab work reviewed Total cholesterol 176 LDL 93 A1c 5.8  EKG personally reviewed by myself on todays visit EKG Interpretation Date/Time:  Friday March 28 2024 08:56:21 EDT Ventricular  Rate:  85 PR Interval:  180 QRS Duration:  84 QT Interval:  342 QTC Calculation: 406 R Axis:   -13  Text Interpretation: Normal sinus rhythm Low voltage QRS When compared with ECG of 18-Jun-2022 07:14, rate has decreased Confirmed by Perla Lye [phone]) on 03/28/2024 8:59:28 AM    PMH:   has a past medical history of Allergy, Arthritis, Asthma, COPD (chronic obstructive pulmonary disease) (HCC), Coronary artery disease, and Heart murmur.  PSH:    Past Surgical History:  Procedure Laterality Date   CORONARY ARTERY BYPASS GRAFT     HERNIA REPAIR     VASECTOMY      Current Outpatient Medications  Medication Sig Dispense Refill   albuterol  (PROVENTIL  HFA) 108 (90 Base) MCG/ACT inhaler Inhale 2 puffs into the lungs once every 6 (six) hours as needed for wheezing. 6.7 g 6   aspirin 81 MG chewable tablet Chew 81 mg by mouth daily.     budesonide -glycopyrrolate-formoterol (BREZTRI  AEROSPHERE) 160-9-4.8 MCG/ACT AERO inhaler Inhale 2 puffs into the lungs 2 (two) times daily. 42.8 g 3   nicotine polacrilex (COMMIT) 4 MG lozenge Place inside cheek.     rosuvastatin  (CRESTOR ) 20 MG tablet Take 1 tablet (20 mg total) by mouth daily. 90 tablet 3   No current facility-administered medications for this visit.    Allergies:   Penicillins and Codeine   Social History:  The patient  reports that he quit smoking about 20 months ago. His smoking use included cigarettes. He started smoking about 52 years  ago. He has a 51 pack-year smoking history. He has never used smokeless tobacco. He reports that he does not use drugs.   Family History:   family history includes Cancer in his maternal grandmother; Emphysema in his mother; Heart failure in his father.    Review of Systems: Review of Systems  Constitutional: Negative.   HENT: Negative.    Respiratory: Negative.    Cardiovascular: Negative.   Gastrointestinal: Negative.   Musculoskeletal: Negative.   Neurological: Negative.    Psychiatric/Behavioral: Negative.    All other systems reviewed and are negative.   PHYSICAL EXAM: VS:  BP 138/72 (BP Location: Right Arm, Patient Position: Sitting, Cuff Size: Normal)   Pulse 85   Ht 5' 10 (1.778 m)   Wt 144 lb 4 oz (65.4 kg)   SpO2 96%   BMI 20.70 kg/m  , BMI Body mass index is 20.7 kg/m. GEN: Well nourished, well developed, in no acute distress HEENT: normal Neck: no JVD, carotid bruits, or masses Cardiac: RRR; no murmurs, rubs, or gallops,no edema  Respiratory:  clear to auscultation bilaterally, normal work of breathing GI: soft, nontender, nondistended, + BS MS: no deformity or atrophy Skin: warm and dry, no rash Neuro:  Strength and sensation are intact Psych: euthymic mood, full affect  Recent Labs: 09/14/2023: ALT 23; BUN 11; Creatinine, Ser 0.92; Hemoglobin 14.9; Platelets CANCELED; Potassium 4.6; Sodium 126; TSH 2.220    Lipid Panel Lab Results  Component Value Date   CHOL 176 09/14/2023   HDL 68 09/14/2023   LDLCALC 93 09/14/2023   TRIG 84 09/14/2023      Wt Readings from Last 3 Encounters:  03/28/24 144 lb 4 oz (65.4 kg)  03/17/24 146 lb (66.2 kg)  11/13/23 141 lb (64 kg)     ASSESSMENT AND PLAN:  Problem List Items Addressed This Visit       Cardiology Problems   Primary hypertension   Relevant Orders   EKG 12-Lead (Completed)   HLD (hyperlipidemia)   CAD (coronary artery disease) - Primary   Relevant Orders   EKG 12-Lead (Completed)     Other   COPD (chronic obstructive pulmonary disease) (HCC)   Coronary artery disease with stable angina Severe three-vessel coronary disease by cardiac catheterization in 2000 Underwent Coronary Artery Bypass Grafting x 3 (LIMA to LAD, SVG to OM4, SVG to D2)  through [facility] 2021 - Smoking cessation recommended - Discussed anginal symptoms to watch for - Reports recent treadmill stress echo in 2023 with outside cardiology - For any worsening anginal symptoms, would consider Myoview, he is  willing to hold off at this time  Hyperlipidemia Recommend he continue Crestor  20 daily, we will add Zetia  10 mg daily to achieve goal LDL less than 55  COPD Long history of smoking, smoking cessation recommended On inhalers  PAD/carotid stenosis Occluded on left, 60 to 90% disease on right several years ago Repeat carotid ultrasound ordered  Elevated blood pressure Numbers high end of the range, recommend close monitoring at home Prior numbers listed in the computer ranging from 97 up to 126 systolic  Signed, Velinda Lunger, M.D., Ph.D. [facility] [place], [place] [phone]

## 2024-03-28 ENCOUNTER — Encounter: Payer: Self-pay | Admitting: Cardiovascular Disease

## 2024-03-28 ENCOUNTER — Ambulatory Visit: Attending: Cardiovascular Disease | Admitting: Cardiovascular Disease

## 2024-03-28 VITALS — BP 138/72 | HR 85 | Ht 70.0 in | Wt 144.2 lb

## 2024-03-28 DIAGNOSIS — I10 Essential (primary) hypertension: Secondary | ICD-10-CM | POA: Diagnosis not present

## 2024-03-28 DIAGNOSIS — J432 Centrilobular emphysema: Secondary | ICD-10-CM | POA: Diagnosis not present

## 2024-03-28 DIAGNOSIS — I6523 Occlusion and stenosis of bilateral carotid arteries: Secondary | ICD-10-CM | POA: Diagnosis not present

## 2024-03-28 DIAGNOSIS — E782 Mixed hyperlipidemia: Secondary | ICD-10-CM | POA: Diagnosis not present

## 2024-03-28 DIAGNOSIS — I25118 Atherosclerotic heart disease of native coronary artery with other forms of angina pectoris: Secondary | ICD-10-CM

## 2024-03-28 MED ORDER — EZETIMIBE 10 MG PO TABS: 10.0000 mg | ORAL_TABLET | Freq: Every day | ORAL | 3 refills | Status: AC

## 2024-03-28 NOTE — Patient Instructions (Addendum)
 Medication Instructions:   Please start zetia  10 mg daily for cholesterol  If you need a refill on your cardiac medications before your next appointment, please call your pharmacy.   Lab work: No new labs needed  Testing/Procedures: Your physician has requested that you have a carotid duplex. This test is an ultrasound of the carotid arteries in your neck. It looks at blood flow through these arteries that supply the brain with blood.   Allow one hour for this exam.  There are no restrictions or special instructions.  This will take place at [address] [facility] [address], [place] [address]  Please note: We ask at that you not bring children with you during ultrasound (echo/ vascular) testing. Due to room size and safety concerns, children are not allowed in the ultrasound rooms during exams. Our front office staff cannot provide observation of children in our lobby area while testing is being conducted. An adult accompanying a patient to their appointment will only be allowed in the ultrasound room at the discretion of the ultrasound technician under special circumstances. We apologize for any inconvenience.    Follow-Up: At [facility], you and your health needs are our priority.  As part of our continuing mission to provide you with exceptional heart care, we have created designated Provider Care Teams.  These Care Teams include your primary Cardiologist (physician) and Advanced Practice Providers (APPs -  Physician Assistants and Nurse Practitioners) who all work together to provide you with the care you need, when you need it.  You will need a follow up appointment in 12 months  Providers on your designated Care Team:   Lonni Meager, NP Bernardino Bring, PA-C Cadence [place], [place]  COVID-19 Vaccine Information can be found at: [email or web address] For questions related to vaccine distribution or appointments,  please email vaccine@ [email or web address] or call [phone].

## 2024-04-08 ENCOUNTER — Ambulatory Visit: Admitting: Pulmonary Disease

## 2024-04-08 ENCOUNTER — Telehealth: Payer: Self-pay

## 2024-04-08 NOTE — Telephone Encounter (Signed)
 Gave pt a call,pt is coming up due for reenrollment on [place] Breztri ,left a HIPAA VM,will mail out pt portion and fax provider portion.

## 2024-04-09 ENCOUNTER — Telehealth: Payer: Self-pay | Admitting: Cardiovascular Disease

## 2024-04-09 NOTE — Telephone Encounter (Signed)
-----   Message from Nurse Olivia HERO sent at 04/08/2024  3:11 PM EDT ----- Can you scheduled patient for Carotid US  please?

## 2024-04-09 NOTE — Telephone Encounter (Signed)
 LVM to schedule Carotid appt

## 2024-04-11 NOTE — Telephone Encounter (Signed)
 Received provider portion [place] Breztri  back from provider office.

## 2024-05-02 NOTE — Progress Notes (Signed)
 Pharmacy Quality Measure Review  This patient is appearing on a report for being at risk of failing the adherence measure for hypertension (ACEi/ARB) medications this calendar year.   Medication: enalapril  Last fill date: 10/16/23 for 90 day supply  Medication was discontinued by PCP on 03/17/24. Patient will fail measure. No action needed at this time.  Richardson Dubree E. Marsh, PharmD, CPP Clinical Pharmacist [facility] [phone]

## 2024-05-19 ENCOUNTER — Ambulatory Visit (INDEPENDENT_AMBULATORY_CARE_PROVIDER_SITE_OTHER): Admitting: Family Medicine

## 2024-05-19 ENCOUNTER — Encounter: Payer: Self-pay | Admitting: Family Medicine

## 2024-05-19 VITALS — BP 144/78 | HR 71 | Ht 70.0 in | Wt 149.3 lb

## 2024-05-19 DIAGNOSIS — J438 Other emphysema: Secondary | ICD-10-CM

## 2024-05-19 DIAGNOSIS — R0981 Nasal congestion: Secondary | ICD-10-CM | POA: Diagnosis not present

## 2024-05-19 DIAGNOSIS — I10 Essential (primary) hypertension: Secondary | ICD-10-CM

## 2024-05-19 DIAGNOSIS — E43 Unspecified severe protein-calorie malnutrition: Secondary | ICD-10-CM

## 2024-05-19 DIAGNOSIS — E782 Mixed hyperlipidemia: Secondary | ICD-10-CM

## 2024-05-19 DIAGNOSIS — J439 Emphysema, unspecified: Secondary | ICD-10-CM | POA: Diagnosis not present

## 2024-05-19 DIAGNOSIS — I25118 Atherosclerotic heart disease of native coronary artery with other forms of angina pectoris: Secondary | ICD-10-CM

## 2024-05-19 DIAGNOSIS — J432 Centrilobular emphysema: Secondary | ICD-10-CM

## 2024-05-19 MED ORDER — AZELASTINE HCL 0.1 % NA SOLN: 2.0000 | Freq: Two times a day (BID) | NASAL | 12 refills | Status: AC

## 2024-05-19 MED ORDER — ENALAPRIL MALEATE 2.5 MG PO TABS: 2.5000 mg | ORAL_TABLET | Freq: Every day | ORAL | 1 refills | Status: AC

## 2024-05-19 MED ORDER — BREZTRI AEROSPHERE 160-9-4.8 MCG/ACT IN AERO: 2.0000 | INHALATION_SPRAY | Freq: Two times a day (BID) | RESPIRATORY_TRACT | 6 refills | Status: AC

## 2024-05-19 MED ORDER — CETIRIZINE HCL 10 MG PO TABS
10.0000 mg | ORAL_TABLET | Freq: Every day | ORAL | 11 refills | Status: AC
Start: 2024-05-19 — End: ?

## 2024-05-19 NOTE — Assessment & Plan Note (Signed)
  Essential hypertension, chronic  Blood pressure initially elevated at 150/91 mmHg, improved to 144/78 mmHg upon recheck. Reports symptoms of orthostatic hypotension. Previously on enalapril , considering dose adjustment to manage blood pressure fluctuations. - Prescribed enalapril  2.5 mg once daily. - Instructed to monitor blood pressure daily, especially after physical activity. - Advised to take enalapril  only if blood pressure exceeds 140/90 mmHg.

## 2024-05-19 NOTE — Assessment & Plan Note (Signed)
 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 Reports improved breathing since last visit, possibly due to resolution of an acute issue. No recent pulmonology follow-up or lung function tests. - Continue budesonide , glycopyrrolate, formoterol inhaler, two puffs twice daily. - Refilled Breztri  inhaler. - Scheduled follow-up in four months. -recommended pulm follow up, referral placed during prior encounter

## 2024-05-19 NOTE — Assessment & Plan Note (Signed)
 Chronic, stable  Follows with cardiology Patient adherent to regimen of ASA 81mg , crestor  10mg  Continue Zetia  10mg  daily

## 2024-05-19 NOTE — Assessment & Plan Note (Signed)
  Nasal congestion Chronic nasal congestion managed with Afrin, which is not recommended for long-term use. Discussed alternative treatments including nasal sprays and antihistamines. - Prescribed Astelin 0.1% nasal spray, two sprays in each nostril twice daily. - Prescribed Zyrtec 10 mg for nasal congestion.

## 2024-05-19 NOTE — Progress Notes (Signed)
 Established patient visit   Patient: Guy Bowen   DOB: [birth year]   68 y.o. Male  MRN: [id number] Visit Date: 05/19/2024  Today's healthcare provider: Rockie Agent, MD   Chief Complaint  Patient presents with   Medical Management of Chronic Issues    Patient is present to follow up chronic conditions, patient reports doing well overall     Subjective     HPI     Medical Management of Chronic Issues    Additional comments: Patient is present to follow up chronic conditions, patient reports doing well overall        Last edited by Cherry Chiquita HERO, CMA on 05/19/2024 10:38 AM.       Discussed the use of AI scribe software for clinical note transcription with the patient, who gave verbal consent to proceed.  History of Present Illness Guy Bowen is a 68 year old male with chronic emphysema who presents for follow-up.  He has been doing well overall since his last visit two months ago when he experienced worsening COPD symptoms and dyspnea on exertion. He has been using Breztri  inhaler regularly, two puffs twice daily, and continues on budesonide , glycopyrrolate, and formoterol with the same dosing schedule. His breathing has improved, and he does not get out of breath as quickly.  He has not had any recent follow-ups with pulmonology. 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 No new appointments or lung function tests since the scan.  He experiences persistent nasal congestion, using Afrin daily, and has tried Mucinex  without relief. He has not used Zyrtec or Allegra for this issue.  He has gained weight and attributes this to using high-calorie Boost drinks and a better appetite since reducing smoking.  His blood pressure has been variable, with occasional symptoms of hypotension when standing, but he has not experienced any  syncope.     Past Medical History:  Diagnosis Date   Allergy    Arthritis    Asthma    COPD (chronic obstructive pulmonary disease) (HCC)    Coronary artery disease    Heart murmur     Medications: Outpatient Medications Prior to Visit  Medication Sig   albuterol  (PROVENTIL  HFA) 108 (90 Base) MCG/ACT inhaler Inhale 2 puffs into the lungs once every 6 (six) hours as needed for wheezing.   aspirin 81 MG chewable tablet Chew 81 mg by mouth daily.   ezetimibe  (ZETIA ) 10 MG tablet Take 1 tablet (10 mg total) by mouth daily.   nicotine polacrilex (COMMIT) 4 MG lozenge Place inside cheek.   rosuvastatin  (CRESTOR ) 20 MG tablet Take 1 tablet (20 mg total) by mouth daily.   [DISCONTINUED] budesonide -glycopyrrolate-formoterol (BREZTRI  AEROSPHERE) 160-9-4.8 MCG/ACT AERO inhaler Inhale 2 puffs into the lungs 2 (two) times daily.   No facility-administered medications prior to visit.    Review of Systems  Last CBC Lab Results  Component Value Date   WBC 6.0 09/14/2023   HGB 14.9 09/14/2023   HCT 44.3 09/14/2023   MCV 86 09/14/2023   MCH 29.0 09/14/2023   RDW 13.8 09/14/2023   PLT CANCELED 09/14/2023   Last metabolic panel Lab Results  Component Value Date   GLUCOSE 90 09/14/2023   NA 126 (L) 09/14/2023   K 4.6 09/14/2023   CL 89 (L) 09/14/2023   CO2 24 09/14/2023   BUN 11 09/14/2023  CREATININE 0.92 09/14/2023   EGFR 90 09/14/2023   CALCIUM  9.8 09/14/2023   PROT 7.1 09/14/2023   ALBUMIN 4.5 09/14/2023   LABGLOB 2.6 09/14/2023   AGRATIO 1.9 08/15/2022   BILITOT 0.2 09/14/2023   ALKPHOS 52 09/14/2023   AST 17 09/14/2023   ALT 23 09/14/2023   ANIONGAP 8 06/18/2022   Last lipids Lab Results  Component Value Date   CHOL 176 09/14/2023   HDL 68 09/14/2023   LDLCALC 93 09/14/2023   TRIG 84 09/14/2023   CHOLHDL 2.6 09/14/2023   Last hemoglobin A1c Lab Results  Component Value Date   HGBA1C 5.8 (H) 09/14/2023   Last thyroid  functions Lab Results  Component  Value Date   TSH 2.220 09/14/2023   FREET4 1.32 03/14/2023   Last vitamin D No results found for: 25OHVITD2, 25OHVITD3, VD25OH Last vitamin B12 and Folate Lab Results  Component Value Date   VITAMINB12 511 09/14/2023   FOLATE 7.1 09/14/2023        Objective    BP (!) 144/78 (Cuff Size: Normal)   Pulse 71   Ht 5' 10 (1.778 m)   Wt 149 lb 4.8 oz (67.7 kg)   SpO2 100%   BMI 21.42 kg/m  BP Readings from Last 3 Encounters:  05/19/24 (!) 144/78  03/28/24 138/72  03/17/24 126/80   Wt Readings from Last 3 Encounters:  05/19/24 149 lb 4.8 oz (67.7 kg)  03/28/24 144 lb 4 oz (65.4 kg)  03/17/24 146 lb (66.2 kg)        Physical Exam  Physical Exam VITALS: BP- 144/78 MEASUREMENTS: Weight- 149, BMI- 21.0. CHEST: Clear to auscultation bilaterally, no wheezes, or crackles. CARD: RRR     No results found for any visits on 05/19/24.  Assessment & Plan     Problem List Items Addressed This Visit     CAD (coronary artery disease)   Chronic, stable  Follows with cardiology Patient adherent to regimen of ASA 81mg , crestor  10mg  Continue Zetia  10mg  daily       Relevant Medications   enalapril  (VASOTEC ) 2.5 MG tablet   Chronic nasal congestion    Nasal congestion Chronic nasal congestion managed with Afrin, which is not recommended for long-term use. Discussed alternative treatments including nasal sprays and antihistamines. - Prescribed Astelin 0.1% nasal spray, two sprays in each nostril twice daily. - Prescribed Zyrtec 10 mg for nasal congestion.      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 Reports improved breathing since last visit, possibly due to resolution of an acute issue. No recent  pulmonology follow-up or lung function tests. - Continue budesonide , glycopyrrolate, formoterol inhaler, two puffs twice daily. - Refilled Breztri  inhaler. - Scheduled follow-up in four months. -recommended pulm follow up, referral placed during prior encounter       Relevant Medications   azelastine (ASTELIN) 0.1 % nasal spray   cetirizine (ZYRTEC) 10 MG tablet   budesonide -glycopyrrolate-formoterol (BREZTRI  AEROSPHERE) 160-9-4.8 MCG/ACT AERO inhaler   HLD (hyperlipidemia)   Chronic  Continue Crestor  20mg  daily and zEtia  10mg  daily  Follow up with cardiology        Relevant Medications   enalapril  (VASOTEC ) 2.5 MG tablet   Primary hypertension - Primary    Essential hypertension, chronic  Blood pressure initially elevated at 150/91 mmHg, improved to 144/78 mmHg upon recheck. Reports symptoms of orthostatic hypotension. Previously on enalapril , considering dose adjustment to manage blood pressure fluctuations. - Prescribed enalapril  2.5 mg once daily. - Instructed to monitor blood pressure daily, especially after physical activity. - Advised to take enalapril  only if blood pressure exceeds 140/90 mmHg.      Relevant Medications   enalapril  (VASOTEC ) 2.5 MG tablet   Protein-calorie malnutrition, severe    Assessment and Plan Assessment & Plan       Return in about 4 months (around 09/16/2024) for COPD, HTN.         Rockie Agent, MD  [facility] [phone] (phone) [phone] (fax)  [facility]

## 2024-05-19 NOTE — Patient Instructions (Addendum)
              To keep you healthy, please keep in mind the following health maintenance items that you are due for:   Health Maintenance Due  Topic Date Due   Hepatitis C Screening  Never done   Zoster Vaccines- Shingrix (1 of 2) Never done     Best Wishes,   Dr. Lang

## 2024-05-19 NOTE — Assessment & Plan Note (Signed)
 Chronic  Continue Crestor  20mg  daily and zEtia  10mg  daily  Follow up with cardiology

## 2024-05-28 ENCOUNTER — Ambulatory Visit: Attending: Cardiovascular Disease

## 2024-05-30 NOTE — Telephone Encounter (Signed)
 Left a HIPAA VM pt has not return pap Az&Me Breztri .

## 2024-06-05 ENCOUNTER — Telehealth: Payer: Self-pay | Admitting: Cardiovascular Disease

## 2024-06-05 NOTE — Telephone Encounter (Signed)
 Called for Carotid and lVM

## 2024-06-05 NOTE — Telephone Encounter (Signed)
-----   Message from Nurse Olivia HERO sent at 06/02/2024  5:10 PM EST ----- Will you reschedule this patient's carotid us  please?

## 2024-06-11 NOTE — Telephone Encounter (Signed)
 Left a HIPAA Vmpt has not return call or PAP AZ&ME

## 2024-06-27 ENCOUNTER — Other Ambulatory Visit: Payer: Self-pay | Admitting: Family Medicine

## 2024-06-27 DIAGNOSIS — J438 Other emphysema: Secondary | ICD-10-CM

## 2024-06-27 DIAGNOSIS — R0602 Shortness of breath: Secondary | ICD-10-CM

## 2024-06-30 ENCOUNTER — Other Ambulatory Visit: Payer: Self-pay | Admitting: Family Medicine

## 2024-06-30 DIAGNOSIS — I2510 Atherosclerotic heart disease of native coronary artery without angina pectoris: Secondary | ICD-10-CM

## 2024-07-01 NOTE — Telephone Encounter (Signed)
" ° °  07/01/2024  Guy Bowen Seats  DOB: Aug 04, 1955 MRN: [id number]  Attempted to contact patient for PAP re-enrollment. Left HIPAA compliant message for patient to return my call at their convenience.   Florine Sprenkle E. Marsh, PharmD Clinical Pharmacist [facility] [phone]  "

## 2024-09-16 ENCOUNTER — Ambulatory Visit: Admitting: Family Medicine
